# Patient Record
Sex: Female | Born: 1975
Health system: Southern US, Community
[De-identification: ages and names within clinical notes are randomized; demographics above are authoritative.]

## PROBLEM LIST (undated history)

## (undated) DIAGNOSIS — J45909 Unspecified asthma, uncomplicated: Secondary | ICD-10-CM

## (undated) DIAGNOSIS — N83209 Unspecified ovarian cyst, unspecified side: Secondary | ICD-10-CM

## (undated) HISTORY — DX: Unspecified ovarian cyst, unspecified side: N83.209

## (undated) HISTORY — PX: APPENDECTOMY: SHX54

---

## 1997-09-25 ENCOUNTER — Ambulatory Visit (HOSPITAL_COMMUNITY): Admission: RE | Admit: 1997-09-25 | Discharge: 1997-09-25 | Payer: Self-pay | Admitting: Internal Medicine

## 1999-02-12 ENCOUNTER — Emergency Department (HOSPITAL_COMMUNITY): Admission: EM | Admit: 1999-02-12 | Discharge: 1999-02-12 | Payer: Self-pay | Admitting: Internal Medicine

## 2000-07-05 ENCOUNTER — Emergency Department (HOSPITAL_COMMUNITY): Admission: EM | Admit: 2000-07-05 | Discharge: 2000-07-05 | Payer: Self-pay | Admitting: Emergency Medicine

## 2000-07-05 ENCOUNTER — Encounter: Payer: Self-pay | Admitting: Emergency Medicine

## 2001-12-21 ENCOUNTER — Other Ambulatory Visit: Admission: RE | Admit: 2001-12-21 | Discharge: 2001-12-21 | Payer: Self-pay | Admitting: Obstetrics and Gynecology

## 2006-03-24 ENCOUNTER — Ambulatory Visit (HOSPITAL_COMMUNITY): Admission: RE | Admit: 2006-03-24 | Discharge: 2006-03-24 | Payer: Self-pay | Admitting: Obstetrics

## 2006-03-30 ENCOUNTER — Inpatient Hospital Stay (HOSPITAL_COMMUNITY): Admission: RE | Admit: 2006-03-30 | Discharge: 2006-04-01 | Payer: Self-pay | Admitting: Obstetrics & Gynecology

## 2010-05-20 ENCOUNTER — Ambulatory Visit: Payer: Self-pay | Admitting: Obstetrics

## 2010-06-12 ENCOUNTER — Encounter: Payer: Self-pay | Admitting: Maternal & Fetal Medicine

## 2010-08-22 ENCOUNTER — Ambulatory Visit: Payer: Self-pay | Admitting: Obstetrics

## 2010-10-26 ENCOUNTER — Inpatient Hospital Stay (HOSPITAL_COMMUNITY)
Admission: AD | Admit: 2010-10-26 | Discharge: 2010-10-28 | DRG: 775 | Disposition: A | Discharge status: Home / Self Care | Source: Ambulatory Visit | Attending: Obstetrics & Gynecology | Admitting: Obstetrics & Gynecology

## 2010-10-26 LAB — CBC
HCT: 36.7 % (ref 36.0–46.0)
Hemoglobin: 11.9 g/dL — ABNORMAL LOW (ref 12.0–15.0)
MCH: 27.1 pg (ref 26.0–34.0)
MCHC: 32.4 g/dL (ref 30.0–36.0)
MCV: 83.6 fL (ref 78.0–100.0)
Platelets: 401 10*3/uL — ABNORMAL HIGH (ref 150–400)
RBC: 4.39 MIL/uL (ref 3.87–5.11)
RDW: 15.9 % — ABNORMAL HIGH (ref 11.5–15.5)
WBC: 9.4 10*3/uL (ref 4.0–10.5)

## 2010-10-27 LAB — CBC
Hemoglobin: 10 g/dL — ABNORMAL LOW (ref 12.0–15.0)
RBC: 3.74 MIL/uL — ABNORMAL LOW (ref 3.87–5.11)
WBC: 11.4 10*3/uL — ABNORMAL HIGH (ref 4.0–10.5)

## 2010-10-27 LAB — ABO/RH: ABO/RH(D): O POS

## 2010-10-27 LAB — RPR: RPR Ser Ql: NONREACTIVE

## 2010-11-03 ENCOUNTER — Inpatient Hospital Stay (HOSPITAL_COMMUNITY): Admission: AD | Admit: 2010-11-03 | Payer: Self-pay | Source: Home / Self Care | Admitting: Obstetrics

## 2010-11-03 NOTE — H&P (Signed)
  NAMEMARILI, [name] NO.:  [id number]  MEDICAL RECORD NO.:  [id number]  LOCATION:  9106                          FACILITY:  [facility]  PHYSICIAN:  Roseanna Rainbow, M.D.DATE OF BIRTH:  1975/11/23  DATE OF ADMISSION:  10/26/2010 DATE OF DISCHARGE:                             HISTORY & PHYSICAL   CHIEF COMPLAINT:  The patient is a 35 year old para 1 with an estimated date of confinement of October 25, 2010, complaining of rupture of membranes.  HISTORY OF PRESENT ILLNESS:  The patient reports rupture of membranes for clear blood-tinged fluid approximately 2 hours prior to presentation.  She denies contractions.  ALLERGIES:  To ACETAMINOPHEN.  MEDICATIONS:  Please see the medication reconciliation form.  PRENATAL LABORATORY DATA:  Chlamydia probe negative.  Urine culture and sensitivity insignificant growth.  GC probe negative.  A 2-hour GTT normal.  Hepatitis B surface antigen negative.  Hematocrit 34.7, hemoglobin 10.8, HIV nonreactive.  Blood type is O positive, antibody screen negative, platelets 382,000, RPR nonreactive, rubella immune. Sickle cell negative.  GBS negative on Sep 23, 2010.  PAST OBSTETRIC HISTORY:  In November of 2007, she was delivered at 41 weeks 7 pounds 6 ounces, female, vaginal delivery, no complications.  PAST GYNECOLOGIC HISTORY:  She denies.  PAST MEDICAL HISTORY:  No significant history of medical diseases.  PAST SURGICAL HISTORY:  No previous surgery.  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 She is married living with her spouse, not using alcohol, formally a minimal user, has no significant smoking history.  Denies illicit drug use.  FAMILY HISTORY:  Noncontributory.  REVIEW OF SYSTEMS:  GU:  Please see the above.  PHYSICAL EXAMINATION:  VITAL SIGNS:  Stable, afebrile.  Fetal heart tracing baseline 150, moderate long-term variability where tocodynamometer vary uterine  contractions. GENERAL:  No apparent distress. ABDOMEN:  Gravid.  Estimated fetal weight by Thayer Ohm approximately 3300 g.  Sterile vaginal exam, loose 3, 60% effaced.  A forebag was ruptured for light meconium-stained fluid.  Vertex at a -2 station.  ASSESSMENT AND PLAN:  Primipara at term, spontaneous rupture of membranes.  Category 1 fetal heart tracing.  GBS negative.  PLAN:  Admission, augmentation of labor with p.o. Cytotec.  Monitor progress, anticipate a spontaneous vaginal delivery.     Roseanna Rainbow, M.D.     [name]  D:  10/26/2010  T:  10/27/2010  Job:  [id number]  Electronically Signed by Antionette Char M.D. on 11/03/2010 10:52:31 AM

## 2011-12-31 ENCOUNTER — Other Ambulatory Visit: Payer: Self-pay | Admitting: Obstetrics

## 2012-04-18 ENCOUNTER — Encounter (HOSPITAL_BASED_OUTPATIENT_CLINIC_OR_DEPARTMENT_OTHER): Payer: Self-pay | Admitting: *Deleted

## 2012-04-18 ENCOUNTER — Emergency Department (HOSPITAL_BASED_OUTPATIENT_CLINIC_OR_DEPARTMENT_OTHER)
Admission: EM | Admit: 2012-04-18 | Discharge: 2012-04-18 | Disposition: A | Discharge status: Home / Self Care | Payer: No Typology Code available for payment source | Attending: Emergency Medicine | Admitting: Emergency Medicine

## 2012-04-18 DIAGNOSIS — Y93I9 Activity, other involving external motion: Secondary | ICD-10-CM | POA: Insufficient documentation

## 2012-04-18 DIAGNOSIS — R109 Unspecified abdominal pain: Secondary | ICD-10-CM | POA: Insufficient documentation

## 2012-04-18 DIAGNOSIS — M62838 Other muscle spasm: Secondary | ICD-10-CM | POA: Insufficient documentation

## 2012-04-18 DIAGNOSIS — Y9289 Other specified places as the place of occurrence of the external cause: Secondary | ICD-10-CM | POA: Insufficient documentation

## 2012-04-18 DIAGNOSIS — IMO0002 Reserved for concepts with insufficient information to code with codable children: Secondary | ICD-10-CM | POA: Insufficient documentation

## 2012-04-18 MED ORDER — CYCLOBENZAPRINE HCL 10 MG PO TABS: 10.0000 mg | ORAL_TABLET | Freq: Three times a day (TID) | ORAL | Status: DC | PRN
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
## 2012-04-22 NOTE — ED Provider Notes (Signed)
Medical screening examination/treatment/procedure(s) were performed by non-physician practitioner and as supervising physician I was immediately available for consultation/collaboration.   Lanah Steines B. Kariann Wecker, MD 04/22/12 1459 

## 2012-09-08 DIAGNOSIS — N83209 Unspecified ovarian cyst, unspecified side: Secondary | ICD-10-CM

## 2012-09-08 HISTORY — DX: Unspecified ovarian cyst, unspecified side: N83.209

## 2012-09-08 HISTORY — PX: OVARIAN CYST REMOVAL: SHX89

## 2012-09-13 ENCOUNTER — Encounter: Payer: Self-pay | Admitting: Family Medicine

## 2012-09-13 ENCOUNTER — Ambulatory Visit (INDEPENDENT_AMBULATORY_CARE_PROVIDER_SITE_OTHER): Admitting: Family Medicine

## 2012-09-13 VITALS — BP 120/80 | HR 78 | Temp 98.1°F | Wt 194.4 lb

## 2012-09-13 DIAGNOSIS — Z9889 Other specified postprocedural states: Secondary | ICD-10-CM

## 2012-09-13 DIAGNOSIS — Z9049 Acquired absence of other specified parts of digestive tract: Secondary | ICD-10-CM

## 2012-09-13 DIAGNOSIS — N760 Acute vaginitis: Secondary | ICD-10-CM

## 2012-09-13 DIAGNOSIS — Z8742 Personal history of other diseases of the female genital tract: Secondary | ICD-10-CM

## 2012-09-13 DIAGNOSIS — N83209 Unspecified ovarian cyst, unspecified side: Secondary | ICD-10-CM

## 2012-09-13 MED ORDER — OXYCODONE HCL 5 MG PO TABS: 1.0000 mg | ORAL_TABLET | ORAL | Status: DC | PRN

## 2012-09-13 MED ORDER — FLUCONAZOLE 150 MG PO TABS: ORAL_TABLET | ORAL | Status: DC

## 2012-09-13 NOTE — Progress Notes (Signed)
  Subjective:    Patient ID: [name], female    DOB: [birth year], 37 y.o.   MRN: [id number]  HPI Pt was brought to ER last wed with abd pain.  RLQ pain --she was found to have ovarian and appedix abnormalitly so on Thursday they removed the AP and ovarian cyst.   Pt is here for f/u and is feeling better.   See hospital d/c paperwork---[facility]  Review of Systems As above     Objective:   Physical Exam  BP 120/80  Pulse 78  Temp(Src) 98.1 F (36.7 C) (Oral)  Wt 194 lb 6.4 oz (88.179 kg)  SpO2 98% General appearance: alert, cooperative, appears stated age and no distress Lungs: clear to auscultation bilaterally Heart: S1, S2 normal Abdomen: normal findings: soft , + scars healing well but umbilical scar and LLQ scar causing pain      Assessment & Plan:  S/p AP and ovarian cystectomy---pt healing well.  Out of work until 5/22  No driving until next week and if she still needs pain meds not until 522

## 2012-09-26 ENCOUNTER — Telehealth: Payer: Self-pay | Admitting: General Practice

## 2012-09-26 NOTE — Telephone Encounter (Signed)
Pt called stating that she had been seen on 08/28/77 for a Hospital Follow up due to an appendectomy. Pt wants to know if you need to reevaluate her again before she returns to work. Pt states that all in all things are healing well, still has slight tenderness around the surgery site. Pt wanted to know if she can remain out of work until 10/04/12, due to the fact she would rather return to work at the beginning of a week instead of the end of the week. Was originally told by Provider she could return to work on 09-29-12. Please advise.

## 2012-09-26 NOTE — Telephone Encounter (Signed)
Pt aware.

## 2012-09-26 NOTE — Telephone Encounter (Signed)
Ok to return 5/27 and she was actually seen 5/6==--- not in 1979

## 2012-09-28 ENCOUNTER — Encounter: Payer: Self-pay | Admitting: Obstetrics

## 2012-09-28 ENCOUNTER — Ambulatory Visit (INDEPENDENT_AMBULATORY_CARE_PROVIDER_SITE_OTHER): Admitting: Obstetrics

## 2012-09-28 VITALS — BP 104/80 | HR 83 | Temp 97.5°F | Wt 191.0 lb

## 2012-09-28 DIAGNOSIS — N83209 Unspecified ovarian cyst, unspecified side: Secondary | ICD-10-CM

## 2012-09-28 DIAGNOSIS — K37 Unspecified appendicitis: Secondary | ICD-10-CM

## 2012-09-28 NOTE — Progress Notes (Signed)
Subjective:     [name] is a 37 y.o. female here for 2 1/2 weeks post op follow up from ovarian cyst rupture, drainage and removal at [facility].  Current complaints: none.     Gynecologic History Patient's last menstrual period was 09/13/2012. Contraception: none   The following portions of the patient's history were reviewed and updated as appropriate: allergies, current medications, past family history, past medical history, past social history, past surgical history and problem list.  Review of Systems Pertinent items are noted in HPI.    Objective:    Abdomen: soft, non-tender; bowel sounds normal; no masses,  no organomegaly    Assessment:    Healthy female exam.   S/P ovarian cystectomy and appendectomy.  Doing well. Plan:    Follow up in: 6 months.

## 2012-10-04 ENCOUNTER — Encounter: Payer: Self-pay | Admitting: Obstetrics

## 2012-10-05 ENCOUNTER — Emergency Department (HOSPITAL_BASED_OUTPATIENT_CLINIC_OR_DEPARTMENT_OTHER)
Admission: EM | Admit: 2012-10-05 | Discharge: 2012-10-05 | Disposition: A | Discharge status: Home / Self Care | Attending: Emergency Medicine | Admitting: Emergency Medicine

## 2012-10-05 ENCOUNTER — Emergency Department (HOSPITAL_BASED_OUTPATIENT_CLINIC_OR_DEPARTMENT_OTHER)

## 2012-10-05 ENCOUNTER — Encounter (HOSPITAL_BASED_OUTPATIENT_CLINIC_OR_DEPARTMENT_OTHER): Payer: Self-pay | Admitting: *Deleted

## 2012-10-05 ENCOUNTER — Encounter: Payer: Self-pay | Admitting: Obstetrics

## 2012-10-05 ENCOUNTER — Telehealth: Payer: Self-pay | Admitting: Family Medicine

## 2012-10-05 DIAGNOSIS — Z9089 Acquired absence of other organs: Secondary | ICD-10-CM | POA: Insufficient documentation

## 2012-10-05 DIAGNOSIS — Z8742 Personal history of other diseases of the female genital tract: Secondary | ICD-10-CM | POA: Insufficient documentation

## 2012-10-05 DIAGNOSIS — T814XXA Infection following a procedure, initial encounter: Secondary | ICD-10-CM

## 2012-10-05 DIAGNOSIS — Y838 Other surgical procedures as the cause of abnormal reaction of the patient, or of later complication, without mention of misadventure at the time of the procedure: Secondary | ICD-10-CM | POA: Insufficient documentation

## 2012-10-05 DIAGNOSIS — R1032 Left lower quadrant pain: Secondary | ICD-10-CM | POA: Insufficient documentation

## 2012-10-05 DIAGNOSIS — T8140XA Infection following a procedure, unspecified, initial encounter: Secondary | ICD-10-CM | POA: Insufficient documentation

## 2012-10-05 HISTORY — DX: Unspecified asthma, uncomplicated: J45.909

## 2012-10-05 LAB — BASIC METABOLIC PANEL
CO2: 23 mEq/L (ref 19–32)
Glucose, Bld: 86 mg/dL (ref 70–99)
Potassium: 3.6 mEq/L (ref 3.5–5.1)
Sodium: 139 mEq/L (ref 135–145)

## 2012-10-05 LAB — CBC WITH DIFFERENTIAL/PLATELET
Eosinophils Absolute: 0.1 10*3/uL (ref 0.0–0.7)
Eosinophils Relative: 2 % (ref 0–5)
Hemoglobin: 12.2 g/dL (ref 12.0–15.0)
Lymphocytes Relative: 28 % (ref 12–46)
Lymphs Abs: 2.4 10*3/uL (ref 0.7–4.0)
MCH: 27.8 pg (ref 26.0–34.0)
MCV: 84.7 fL (ref 78.0–100.0)
Monocytes Relative: 7 % (ref 3–12)
Neutrophils Relative %: 63 % (ref 43–77)
RBC: 4.39 MIL/uL (ref 3.87–5.11)
WBC: 8.3 10*3/uL (ref 4.0–10.5)

## 2012-10-05 MED ORDER — CLINDAMYCIN HCL 150 MG PO CAPS: 300.0000 mg | ORAL_CAPSULE | Freq: Three times a day (TID) | ORAL | Status: DC

## 2012-10-05 MED ORDER — IOHEXOL 300 MG/ML  SOLN
100.0000 mL | Freq: Once | INTRAMUSCULAR | Status: AC | PRN
  Administered 2012-10-05: 100 mL via INTRAVENOUS

## 2012-10-05 MED ORDER — OXYCODONE HCL 5 MG PO TABS: 5.0000 mg | ORAL_TABLET | ORAL | Status: DC | PRN

## 2012-10-05 MED ORDER — ONDANSETRON HCL 4 MG/2ML IJ SOLN
4.0000 mg | Freq: Once | INTRAMUSCULAR | Status: AC
  Administered 2012-10-05: 4 mg via INTRAVENOUS
  Filled 2012-10-05: qty 2

## 2012-10-05 MED ORDER — MORPHINE SULFATE 4 MG/ML IJ SOLN
4.0000 mg | Freq: Once | INTRAMUSCULAR | Status: AC
  Administered 2012-10-05: 4 mg via INTRAVENOUS
  Filled 2012-10-05: qty 1

## 2012-10-05 NOTE — Telephone Encounter (Signed)
Patient Information:  Caller Name: [name]  Phone: [phone]  Patient: Shelby, Collins  Gender: Female  DOB: [birth year]  Age: 37 Years  PCP: Lelon Perla.  Pregnant: No  Office Follow Up:  Does the office need to follow up with this patient?: No  Instructions For The Office: N/A  RN Note:  Has been off BCP for 1 month. Temp unknown; currently at work. Post op abdominal wound pain rated 7-8/10 when sitting and worse when active.  No appointments remain for 10/05/12; since its after 1600, advised to go to [facility] now.   Symptoms  Reason For Call & Symptoms: Left abdominal incision tentder to the touch and inflammed.  Noted minimal creamy disharge from center of incision 10/04/12.  Had emergency appendectomy and cystectomy 09/08/12 at [facility]  in [place].  Reviewed Health History In EMR: Yes  Reviewed Medications In EMR: Yes  Reviewed Allergies In EMR: Yes  Reviewed Surgeries / Procedures: Yes  Date of Onset of Symptoms: 10/01/2012  Treatments Tried: Peroxide  Treatments Tried Worked: Yes OB / GYN:  LMP: 09/11/2012  Guideline(s) Used:  Wound Infection  Disposition Per Guideline:   Go to Office Now  Reason For Disposition Reached:   Severe pain in the wound  Advice Given:  Pain Medicines:  For pain relief, you can take either acetaminophen, ibuprofen, or naproxen.  They are over-the-counter (OTC) pain drugs. You can buy them at the drugstore.  Call Back If:   Wound becomes more tender  Redness starts to spread  Pus, drainage, or fever occurs  You become worse  Patient Will Follow Care Advice:  YES

## 2012-10-05 NOTE — ED Provider Notes (Signed)
Medical screening examination/treatment/procedure(s) were performed by non-physician practitioner and as supervising physician I was immediately available for consultation/collaboration.   Erla Bacchi, MD 10/05/12 2353 

## 2012-10-05 NOTE — ED Provider Notes (Signed)
History     CSN: [id number]  Arrival date & time 10/05/12  1652   First MD Initiated Contact with Patient 10/05/12 1705      Chief Complaint  Patient presents with  . Wound Infection    (Consider location/radiation/quality/duration/timing/severity/associated sxs/prior treatment) HPI Comments: Pt had an appendectomy 3 weeks ago and in the last week she has noted redness and drainage at the left lower abdomen surgical site:denies fever:pt states that she had some white drainage from the area last night:pt states that she has noted to redness to the area over the last week  The history is provided by the patient. No language interpreter was used.    Past Medical History  Diagnosis Date  . Ovarian cyst rupture 09/08/2012    left    Past Surgical History  Procedure Laterality Date  . Appendectomy    . Ovarian cyst removal Right 09/08/2012    drained and removed    History reviewed. No pertinent family history.  History  Substance Use Topics  . Smoking status: Never Smoker   . Smokeless tobacco: Never Used  . Alcohol Use: No    OB History   Grav Para Term Preterm Abortions TAB SAB Ect Mult Living                  Review of Systems  Constitutional: Negative.   Respiratory: Negative.   Cardiovascular: Negative.     Allergies  Acetaminophen  Home Medications   Current Outpatient Rx  Name  Route  Sig  Dispense  Refill  . ibuprofen (ADVIL,MOTRIN) 200 MG tablet   Oral   Take 200 mg by mouth every 6 (six) hours as needed for pain.         . Norethindrone Acetate-Ethinyl Estradiol (LOESTRIN 1.5/30, 21,) 1.5-30 MG-MCG tablet   Oral   Take 1 tablet by mouth daily.         Marland Kitchen oxyCODONE (OXY IR/ROXICODONE) 5 MG immediate release tablet   Oral   Take 0.5 tablets (2.5 mg total) by mouth every 4 (four) hours as needed for pain.   30 tablet   0     BP 136/86  Pulse 92  Temp(Src) 97.9 F (36.6 C) (Oral)  Resp 16  Ht 5\' 5"  (1.651 m)  Wt 190 lb (86.183 kg)   BMI 31.62 kg/m2  SpO2 100%  LMP 09/11/2012  Physical Exam  Nursing note and vitals reviewed. Constitutional: She is oriented to person, place, and time. She appears well-developed and well-nourished.  Cardiovascular: Normal rate and regular rhythm.   Pulmonary/Chest: Effort normal and breath sounds normal.  Abdominal: Soft.  Pt tender to the left lower quadrant with mild redness noted:pt has a small firm area, but tenderness extends past the area  Musculoskeletal: Normal range of motion.  Neurological: She is alert and oriented to person, place, and time.  Skin:  Localized area of redness on lower left abdomen:no drainage noted    ED Course  Procedures (including critical care time)  Labs Reviewed  BASIC METABOLIC PANEL  CBC WITH DIFFERENTIAL  CBC WITH DIFFERENTIAL   Ct Pelvis W Contrast  10/05/2012   *RADIOLOGY REPORT*  Clinical Data:  Status post appendectomy and removal of ovarian cysts 4 weeks ago. Pelvic pain and tenderness with discharge from surgical incision.  CT PELVIS WITH CONTRAST  Technique:  Multidetector CT imaging of the pelvis was performed using the standard protocol following the bolus administration of intravenous contrast.  Contrast: OMNIPAQUE IOHEXOL 300 MG/ML  SOLN  Comparison:   None.  Findings:  There is focal stranding in the subcutaneous fat of the left lower quadrant and overlying skin thickening.  This may be a trocar insertion site.  No focal abscess is identified. Clips are seen at the inferior cecal tip related to appendectomy.  There is no evidence of abnormal fluid collection or extraluminal air at the site of appendectomy.  Bowel loops in the pelvis are of normal caliber and unremarkable. A trace amount of fluid is identified adjacent to the uterus. Adnexal regions show no enlarged cysts or masses.  No hernias are identified.  No evidence of free air in the pelvis.  IMPRESSION: Skin thickening and stranding in the subcutaneous fat at the level of the  left lower quadrant.  No focal abscess is identified. Uncomplicated appearing postoperative pelvis.   Original Report Authenticated By: Irish Lack, M.D.     1. Post-operative infection, initial encounter       MDM  No definite abscess:pt to follow up with pcp for recheck in 2 days as surgeons are not local        Teressa Lower, NP 10/05/12 1911

## 2012-10-05 NOTE — ED Notes (Signed)
Pt reports recent appendectomy x 3 weeks ago, lower left abd pain with redness to incision site x 2 days, at times white drainage from area.

## 2012-10-05 NOTE — ED Notes (Signed)
[name] from lab sts cbc specimen is "clotted".

## 2012-10-05 NOTE — Telephone Encounter (Signed)
Pt was called and stated that her incision area where she had emergency appendectomy and cyst removal. Pt states she is in severe pain and has white creamy discharge coming from the incision site. Per Dr. Laury Axon pt advised to seek treatment at the ER for possible infection.

## 2012-10-06 ENCOUNTER — Encounter: Payer: Self-pay | Admitting: Lab

## 2012-10-06 ENCOUNTER — Ambulatory Visit (INDEPENDENT_AMBULATORY_CARE_PROVIDER_SITE_OTHER): Admitting: Family Medicine

## 2012-10-06 ENCOUNTER — Encounter: Payer: Self-pay | Admitting: Family Medicine

## 2012-10-06 VITALS — BP 114/74 | HR 73 | Temp 98.2°F | Wt 194.4 lb

## 2012-10-06 DIAGNOSIS — Z9049 Acquired absence of other specified parts of digestive tract: Secondary | ICD-10-CM

## 2012-10-06 DIAGNOSIS — Z9889 Other specified postprocedural states: Secondary | ICD-10-CM

## 2012-10-06 DIAGNOSIS — T8140XA Infection following a procedure, unspecified, initial encounter: Secondary | ICD-10-CM

## 2012-10-06 NOTE — Assessment & Plan Note (Signed)
With wound infection that is healing well Finish abx F/u 1 week Surgeon is out of town

## 2012-10-06 NOTE — Patient Instructions (Signed)
Wound Infection  A wound infection happens when a type of germ (bacteria) starts growing in the wound. In some cases, this can cause the wound to break open. If cared for properly, the infected wound will heal from the inside to the outside. Wound infections need treatment.  CAUSES  An infection is caused by bacteria growing in the wound.   SYMPTOMS    Increase in redness, swelling, or pain at the wound site.   Increase in drainage at the wound site.   Wound or bandage (dressing) starts to smell bad.   Fever.   Feeling tired or fatigued.   Pus draining from the wound.  TREATMENT   You caregiver will prescribe antibiotic medicine. The wound infection should improve within 24 to 48 hours. Any redness around the wound should stop spreading and the wound should be less painful.   HOME CARE INSTRUCTIONS    Only take over-the-counter or prescription medicines for pain, discomfort, or fever as directed by your caregiver.   Take your antibiotics as directed. Finish them even if you start to feel better.   Gently wash the area with mild soap and water 2 times a day, or as directed. Rinse off the soap. Pat the area dry with a clean towel. Do not rub the wound. This may cause bleeding.   Follow your caregiver's instructions for how often you need to change the dressing.   Apply ointment and a dressing to the wound as directed.   If the dressing sticks, moisten it with soapy water and gently remove it.   Change the bandage right away if it becomes wet, dirty, or develops a bad smell.   Take showers. Do not take tub baths, swim, or do anything that may soak the wound until it is healed.   Avoid exercises that make you sweat heavily.   Use anti-itch medicine as directed by your caregiver. The wound may itch when it is healing. Do not pick or scratch at the wound.   Follow up with your caregiver to get your wound rechecked as directed.  SEEK MEDICAL CARE IF:   You have an increase in swelling, pain, or redness  around the wound.   You have an increase in the amount of pus coming from the wound.   There is a bad smell coming from the wound.   More of the wound breaks open.   You have a fever.  MAKE SURE YOU:    Understand these instructions.   Will watch your condition.   Will get help right away if you are not doing well or get worse.  Document Released: 01/24/2003 Document Revised: 07/20/2011 Document Reviewed: 08/31/2010  Exit[employer] Patient Information 2014 [employer].

## 2012-10-06 NOTE — Progress Notes (Signed)
  Subjective:    Patient ID: [name], female    DOB: 06-26-1975, 37 y.o.   MRN: [id number]  HPI  Pt here to f/u from ER where she went with abd pain and infection in wound from AP.  See ER note.  Pt was put on abx and is feeling much better.  Review of Systems    as above Objective:   Physical Exam BP 114/74  Pulse 73  Temp(Src) 98.2 F (36.8 C) (Oral)  Wt 194 lb 6.4 oz (88.179 kg)  BMI 32.35 kg/m2  SpO2 98%  LMP 09/11/2012 General appearance: alert, cooperative, appears stated age and no distress Abdomen: soft, non-tender; bowel sounds normal; no masses,  no organomegaly and LLQ -- lap hole-- no errythema , nt, some d/c-- pt states it is much better than previous        Assessment & Plan:

## 2012-10-07 ENCOUNTER — Encounter: Payer: Self-pay | Admitting: Obstetrics

## 2012-10-09 LAB — WOUND CULTURE
Gram Stain: NONE SEEN
Gram Stain: NONE SEEN

## 2012-10-12 ENCOUNTER — Encounter: Payer: Self-pay | Admitting: Lab

## 2012-10-13 ENCOUNTER — Encounter: Payer: Self-pay | Admitting: Family Medicine

## 2012-10-13 ENCOUNTER — Ambulatory Visit (INDEPENDENT_AMBULATORY_CARE_PROVIDER_SITE_OTHER): Admitting: Family Medicine

## 2012-10-13 VITALS — BP 118/72 | HR 83 | Temp 98.4°F | Wt 194.0 lb

## 2012-10-13 DIAGNOSIS — R200 Anesthesia of skin: Secondary | ICD-10-CM

## 2012-10-13 DIAGNOSIS — T889XXS Complication of surgical and medical care, unspecified, sequela: Secondary | ICD-10-CM

## 2012-10-13 DIAGNOSIS — R209 Unspecified disturbances of skin sensation: Secondary | ICD-10-CM

## 2012-10-13 DIAGNOSIS — T8149XA Infection following a procedure, other surgical site, initial encounter: Secondary | ICD-10-CM | POA: Insufficient documentation

## 2012-10-13 NOTE — Assessment & Plan Note (Signed)
- 

## 2012-10-13 NOTE — Progress Notes (Signed)
  Subjective:    Patient ID: [name], female    DOB: 10-25-1975, 37 y.o.   MRN: [id number]  HPI Pt here for wound check.  She is feeling much better. No complaints.   Review of Systems As above    Objective:   Physical Exam  BP 118/72  Pulse 83  Temp(Src) 98.4 F (36.9 C) (Oral)  Wt 194 lb (87.998 kg)  BMI 32.28 kg/m2  SpO2 99%  LMP 09/11/2012 General appearance: alert, cooperative, appears stated age and no distress Abdomen: soft, non-tender; bowel sounds normal; no masses,  no organomegaly--wound healed      Assessment & Plan:

## 2012-10-18 ENCOUNTER — Ambulatory Visit (INDEPENDENT_AMBULATORY_CARE_PROVIDER_SITE_OTHER): Admitting: Family Medicine

## 2012-10-18 DIAGNOSIS — Z0000 Encounter for general adult medical examination without abnormal findings: Secondary | ICD-10-CM

## 2012-10-19 NOTE — Progress Notes (Signed)
  Subjective:    Patient ID: [name], female    DOB: 17-Jan-1976, 37 y.o.   MRN: [id number]  HPI  cpe not due   Review of Systems     Objective:   Physical Exam        Assessment & Plan:

## 2012-10-26 ENCOUNTER — Encounter: Payer: Self-pay | Admitting: Family Medicine

## 2012-11-03 ENCOUNTER — Telehealth: Payer: Self-pay | Admitting: Family Medicine

## 2012-11-03 NOTE — Telephone Encounter (Signed)
That referral was entered in error.

## 2012-11-03 NOTE — Telephone Encounter (Signed)
noted 

## 2012-11-03 NOTE — Telephone Encounter (Signed)
In reference to neurology referral entered 10/13/12, [facility] attempted to reach patient multiple times, as did I with no success.  I also mailed patient a letter.  As of today, 11/03/12, patient will not respond.

## 2012-12-12 ENCOUNTER — Ambulatory Visit: Admitting: Obstetrics

## 2012-12-12 ENCOUNTER — Ambulatory Visit: Payer: Self-pay | Admitting: Obstetrics

## 2012-12-21 ENCOUNTER — Ambulatory Visit (INDEPENDENT_AMBULATORY_CARE_PROVIDER_SITE_OTHER): Admitting: Obstetrics

## 2012-12-21 ENCOUNTER — Encounter: Payer: Self-pay | Admitting: Obstetrics

## 2012-12-21 DIAGNOSIS — Z113 Encounter for screening for infections with a predominantly sexual mode of transmission: Secondary | ICD-10-CM

## 2012-12-21 DIAGNOSIS — Z01419 Encounter for gynecological examination (general) (routine) without abnormal findings: Secondary | ICD-10-CM

## 2012-12-21 NOTE — Progress Notes (Signed)
.   Subjective:     [name] is a 37 y.o. female here for a routine exam.  Current complaints: .  Personal health questionnaire reviewed: yes.   Gynecologic History No LMP recorded. Contraception: OCP (estrogen/progesterone) Last Pap: 12/2009. Results were: normal Last mammogram: N/A  Obstetric History OB History  No data available     The following portions of the patient's history were reviewed and updated as appropriate: allergies, current medications, past family history, past medical history, past social history, past surgical history and problem list.  Review of Systems Pertinent items are noted in HPI.    Objective:    General appearance: alert and no distress Breasts: normal appearance, no masses or tenderness Abdomen: normal findings: soft, non-tender Pelvic: cervix normal in appearance, external genitalia normal, no adnexal masses or tenderness, no cervical motion tenderness, uterus normal size, shape, and consistency and vagina normal without discharge    Assessment:    Healthy female exam.    Plan:    Contraception: OCP (estrogen/progesterone).   F/U 1 year.

## 2012-12-22 ENCOUNTER — Other Ambulatory Visit: Payer: Self-pay | Admitting: *Deleted

## 2012-12-22 DIAGNOSIS — B379 Candidiasis, unspecified: Secondary | ICD-10-CM

## 2012-12-22 DIAGNOSIS — N760 Acute vaginitis: Secondary | ICD-10-CM

## 2012-12-22 LAB — WET PREP BY MOLECULAR PROBE
Gardnerella vaginalis: POSITIVE — AB
Trichomonas vaginosis: NEGATIVE

## 2012-12-22 LAB — PAP IG W/ RFLX HPV ASCU

## 2012-12-22 LAB — GC/CHLAMYDIA PROBE AMP: CT Probe RNA: NEGATIVE

## 2012-12-22 MED ORDER — FLUCONAZOLE 150 MG PO TABS: 150.0000 mg | ORAL_TABLET | Freq: Once | ORAL | Status: DC

## 2012-12-22 MED ORDER — METRONIDAZOLE 500 MG PO TABS: 500.0000 mg | ORAL_TABLET | Freq: Two times a day (BID) | ORAL | Status: DC

## 2012-12-22 NOTE — Progress Notes (Signed)
Pt aware of results and prescription sent to pt's pharmacy. Pt expressed understanding.   

## 2013-07-11 ENCOUNTER — Encounter: Payer: Self-pay | Admitting: Obstetrics

## 2013-07-11 ENCOUNTER — Ambulatory Visit (INDEPENDENT_AMBULATORY_CARE_PROVIDER_SITE_OTHER): Admitting: Obstetrics

## 2013-07-11 VITALS — BP 124/81 | HR 94 | Temp 98.5°F | Ht 64.0 in | Wt 197.0 lb

## 2013-07-11 DIAGNOSIS — N926 Irregular menstruation, unspecified: Secondary | ICD-10-CM

## 2013-07-11 DIAGNOSIS — Z113 Encounter for screening for infections with a predominantly sexual mode of transmission: Secondary | ICD-10-CM

## 2013-07-11 DIAGNOSIS — N760 Acute vaginitis: Secondary | ICD-10-CM | POA: Insufficient documentation

## 2013-07-11 DIAGNOSIS — N939 Abnormal uterine and vaginal bleeding, unspecified: Secondary | ICD-10-CM

## 2013-07-11 NOTE — Progress Notes (Signed)
Subjective:     [name] is a 38 y.o. female here for a routine exam.  Current complaints: Patient is in the office today for a problem visit. Patient states her cycle started on January 31st and didn't end until Thursday. Patient states that at the end of December, early January her cycle was her normal 5 days then went off for one day and came back the next day and was like another normal cycle lasting another week.  Patient states cycle was normal in November and usually only last 5-7 days. Patient states these past cycles were like normal cycles just lasting longer then usual. Personal health questionnaire reviewed: yes.   Gynecologic History Patient's last menstrual period was 06/10/2013. Contraception: OCP (estrogen/progesterone) Last Pap: 12/21/2012. Results were: normal  Obstetric History OB History  Gravida Para Term Preterm AB SAB TAB Ectopic Multiple Living  2 2 2       2     # Outcome Date GA Lbr Len/2nd Weight Sex Delivery Anes PTL Lv  2 TRM 10/26/10 [redacted]w[redacted]d  7 lb 6 oz (3.345 kg) F SVD EPI  Y  1 TRM 03/30/06 [redacted]w[redacted]d  7 lb 8 oz (3.402 kg) F SVD EPI  Y       The following portions of the patient's history were reviewed and updated as appropriate: allergies, current medications, past family history, past medical history, past social history, past surgical history and problem list.  Review of Systems Pertinent items are noted in HPI.    Objective:    General appearance: alert and no distress Abdomen: normal findings: soft, non-tender Pelvic: cervix normal in appearance, exam obscured by obesity, external genitalia normal, no cervical motion tenderness, rectovaginal septum normal, uterus normal size, shape, and consistency and vagina with small amount of cottage cheese discharge.    Assessment:     Healthy female exam.   Probable asymptomatic candida vaginitis.   Plan:    Education reviewed: Management of AUB on OCP's. Contraception: OCP  (estrogen/progesterone). Follow up in: 3 months. May need a stronger OCP.

## 2013-07-12 LAB — GC/CHLAMYDIA PROBE AMP
CT Probe RNA: NEGATIVE
GC PROBE AMP APTIMA: NEGATIVE

## 2013-07-12 LAB — WET PREP BY MOLECULAR PROBE
Candida species: POSITIVE — AB
Gardnerella vaginalis: POSITIVE — AB
Trichomonas vaginosis: NEGATIVE

## 2013-07-14 ENCOUNTER — Other Ambulatory Visit: Payer: Self-pay | Admitting: *Deleted

## 2013-07-14 DIAGNOSIS — B9689 Other specified bacterial agents as the cause of diseases classified elsewhere: Secondary | ICD-10-CM

## 2013-07-14 DIAGNOSIS — N760 Acute vaginitis: Secondary | ICD-10-CM

## 2013-07-14 DIAGNOSIS — B379 Candidiasis, unspecified: Secondary | ICD-10-CM

## 2013-07-14 MED ORDER — FLUCONAZOLE 150 MG PO TABS: 150.0000 mg | ORAL_TABLET | Freq: Once | ORAL | Status: DC

## 2013-07-14 MED ORDER — METRONIDAZOLE 500 MG PO TABS: 500.0000 mg | ORAL_TABLET | Freq: Two times a day (BID) | ORAL | Status: DC

## 2013-08-02 ENCOUNTER — Other Ambulatory Visit: Payer: Self-pay | Admitting: *Deleted

## 2013-08-02 DIAGNOSIS — IMO0001 Reserved for inherently not codable concepts without codable children: Secondary | ICD-10-CM

## 2013-08-02 MED ORDER — NORETHINDRONE ACET-ETHINYL EST 1.5-30 MG-MCG PO TABS: 1.0000 | ORAL_TABLET | Freq: Every day | ORAL | Status: DC

## 2013-12-14 ENCOUNTER — Ambulatory Visit: Admitting: Obstetrics

## 2014-02-27 ENCOUNTER — Other Ambulatory Visit: Payer: Self-pay | Admitting: *Deleted

## 2014-02-27 DIAGNOSIS — Z3041 Encounter for surveillance of contraceptive pills: Secondary | ICD-10-CM

## 2014-02-27 MED ORDER — NORETHINDRONE ACET-ETHINYL EST 1.5-30 MG-MCG PO TABS: 1.0000 | ORAL_TABLET | Freq: Every day | ORAL | Status: DC

## 2014-03-12 ENCOUNTER — Encounter: Payer: Self-pay | Admitting: Obstetrics

## 2014-03-27 ENCOUNTER — Ambulatory Visit: Admitting: Obstetrics

## 2014-04-10 ENCOUNTER — Telehealth: Payer: Self-pay | Admitting: *Deleted

## 2014-04-10 NOTE — Telephone Encounter (Signed)
Pt called to office stating that her BC runs out this week and will need refill to get her to her AEX this month.  In review of chart pt was sent Rx in October with 6 refills.  Return call to pt.  LM making her aware that refills should be on file for [facility].  Pt advised to contact office if any problems filling medication.

## 2014-05-01 ENCOUNTER — Ambulatory Visit: Admitting: Obstetrics

## 2014-08-28 ENCOUNTER — Other Ambulatory Visit: Payer: Self-pay | Admitting: *Deleted

## 2014-08-28 DIAGNOSIS — Z3041 Encounter for surveillance of contraceptive pills: Secondary | ICD-10-CM

## 2014-08-28 MED ORDER — NORETHINDRONE ACET-ETHINYL EST 1.5-30 MG-MCG PO TABS: 1.0000 | ORAL_TABLET | Freq: Every day | ORAL | Status: DC

## 2014-12-03 ENCOUNTER — Encounter: Payer: Self-pay | Admitting: Obstetrics

## 2014-12-03 ENCOUNTER — Other Ambulatory Visit: Payer: Self-pay | Admitting: Obstetrics

## 2014-12-03 ENCOUNTER — Ambulatory Visit (INDEPENDENT_AMBULATORY_CARE_PROVIDER_SITE_OTHER): Admitting: Obstetrics

## 2014-12-03 VITALS — BP 122/79 | HR 81 | Temp 98.1°F | Ht 64.0 in | Wt 177.4 lb

## 2014-12-03 DIAGNOSIS — Z124 Encounter for screening for malignant neoplasm of cervix: Secondary | ICD-10-CM

## 2014-12-03 DIAGNOSIS — Z3041 Encounter for surveillance of contraceptive pills: Secondary | ICD-10-CM | POA: Diagnosis not present

## 2014-12-03 DIAGNOSIS — Z01419 Encounter for gynecological examination (general) (routine) without abnormal findings: Secondary | ICD-10-CM

## 2014-12-03 MED ORDER — NORETHINDRONE ACET-ETHINYL EST 1.5-30 MG-MCG PO TABS
1.0000 | ORAL_TABLET | Freq: Every day | ORAL | Status: DC
Start: 2014-12-03 — End: 2015-12-03

## 2014-12-03 NOTE — Progress Notes (Signed)
Subjective:        [name] is a 39 y.o. female here for a routine exam.  Current complaints: none.    Personal health questionnaire:  Is patient Ashkenazi Jewish, have a family history of breast and/or ovarian cancer: no Is there a family history of uterine cancer diagnosed at age < 72, gastrointestinal cancer, urinary tract cancer, family member who is a Field seismologist syndrome-associated carrier: no Is the patient overweight and hypertensive, family history of diabetes, personal history of gestational diabetes, preeclampsia or PCOS: no Is patient over 33, have PCOS,  family history of premature CHD under age 33, diabetes, smoke, have hypertension or peripheral artery disease:  no At any time, has a partner hit, kicked or otherwise hurt or frightened you?: no Over the past 2 weeks, have you felt down, depressed or hopeless?: no Over the past 2 weeks, have you felt little interest or pleasure in doing things?:no   Gynecologic History Patient's last menstrual period was 11/05/2014 (approximate). Contraception: OCP (estrogen/progesterone) Last Pap: 2014. Results were: normal Last mammogram: n/a. Results were: n/a  Obstetric History OB History  Gravida Para Term Preterm AB SAB TAB Ectopic Multiple Living  2 2 2       2     # Outcome Date GA Lbr Len/2nd Weight Sex Delivery Anes PTL Lv  2 Term 10/26/10 [redacted]w[redacted]d  7 lb 6 oz (3.345 kg) F Vag-Spont EPI  Y  1 Term 03/30/06 [redacted]w[redacted]d  7 lb 8 oz (3.402 kg) F Vag-Spont EPI  Y      Past Medical History  Diagnosis Date  . Ovarian cyst rupture 09/08/2012    left  . Asthma     Past Surgical History  Procedure Laterality Date  . Appendectomy    . Ovarian cyst removal Right 09/08/2012    drained and removed     Current outpatient prescriptions:  .  Norethindrone Acetate-Ethinyl Estradiol (LOESTRIN 1.5/30, 21,) 1.5-30 MG-MCG tablet, Take 1 tablet by mouth daily., Disp: 1 Package, Rfl: 12 .  oxyCODONE (ROXICODONE) 5 MG immediate release tablet,  Take 1 tablet (5 mg total) by mouth every 4 (four) hours as needed for pain. (Patient not taking: Reported on 12/03/2014), Disp: 15 tablet, Rfl: 0 Allergies  Allergen Reactions  . Acetaminophen Hives    History  Substance Use Topics  . Smoking status: Never Smoker   . Smokeless tobacco: Never Used  . Alcohol Use: No    History reviewed. No pertinent family history.    Review of Systems  Constitutional: negative for fatigue and weight loss Respiratory: negative for cough and wheezing Cardiovascular: negative for chest pain, fatigue and palpitations Gastrointestinal: negative for abdominal pain and change in bowel habits Musculoskeletal:negative for myalgias Neurological: negative for gait problems and tremors Behavioral/Psych: negative for abusive relationship, depression Endocrine: negative for temperature intolerance   Genitourinary:negative for abnormal menstrual periods, genital lesions, hot flashes, sexual problems and vaginal discharge Integument/breast: negative for breast lump, breast tenderness, nipple discharge and skin lesion(s)    Objective:       BP 122/79 mmHg  Pulse 81  Temp(Src) 98.1 F (36.7 C)  Ht 5\' 4"  (1.626 m)  Wt 177 lb 6.4 oz (80.468 kg)  BMI 30.44 kg/m2  LMP 11/05/2014 (Approximate) General:   alert  Skin:   no rash or abnormalities  Lungs:   clear to auscultation bilaterally  Heart:   regular rate and rhythm, S1, S2 normal, no murmur, click, rub or gallop  Breasts:   normal without  suspicious masses, skin or nipple changes or axillary nodes  Abdomen:  normal findings: no organomegaly, soft, non-tender and no hernia  Pelvis:  External genitalia: normal general appearance Urinary system: urethral meatus normal and bladder without fullness, nontender Vaginal: normal without tenderness, induration or masses Cervix: normal appearance Adnexa: normal bimanual exam Uterus: anteverted and non-tender, normal size   Lab Review Urine pregnancy  test Labs reviewed yes Radiologic studies reviewed no    Assessment:    Healthy female exam.   Contraceptive surveillance   Plan:    Education reviewed: calcium supplements, low fat, low cholesterol diet, safe sex/STD prevention, self breast exams and weight bearing exercise. Contraception: OCP (estrogen/progesterone). Follow up in: 1 year.   Meds ordered this encounter  Medications  . Norethindrone Acetate-Ethinyl Estradiol (LOESTRIN 1.5/30, 21,) 1.5-30 MG-MCG tablet    Sig: Take 1 tablet by mouth daily.    Dispense:  1 Package    Refill:  12   Orders Placed This Encounter  Procedures  . SureSwab Bacterial Vaginosis/itis

## 2014-12-05 LAB — PAP IG AND HPV HIGH-RISK: HPV DNA High Risk: NOT DETECTED

## 2014-12-06 LAB — SURESWAB BACTERIAL VAGINOSIS/ITIS
Atopobium vaginae: NOT DETECTED Log (cells/mL)
C. albicans, DNA: NOT DETECTED
C. glabrata, DNA: NOT DETECTED
C. parapsilosis, DNA: NOT DETECTED
C. tropicalis, DNA: NOT DETECTED
Gardnerella vaginalis: NOT DETECTED Log (cells/mL)
MEGASPHAERA SPECIES: NOT DETECTED Log (cells/mL)
T. vaginalis RNA, QL TMA: NOT DETECTED

## 2014-12-28 ENCOUNTER — Encounter: Payer: Self-pay | Admitting: *Deleted

## 2015-12-03 ENCOUNTER — Other Ambulatory Visit: Payer: Self-pay | Admitting: Obstetrics

## 2015-12-03 DIAGNOSIS — Z3041 Encounter for surveillance of contraceptive pills: Secondary | ICD-10-CM

## 2015-12-05 ENCOUNTER — Ambulatory Visit: Admitting: Obstetrics

## 2016-10-16 ENCOUNTER — Other Ambulatory Visit: Payer: Self-pay | Admitting: Obstetrics

## 2016-10-16 DIAGNOSIS — Z3041 Encounter for surveillance of contraceptive pills: Secondary | ICD-10-CM

## 2016-11-17 ENCOUNTER — Telehealth: Payer: Self-pay | Admitting: Family Medicine

## 2016-11-17 NOTE — Telephone Encounter (Signed)
Pt dropped off document to be filled out by provider (document Screening Questionnaire for the [facility] page). Pt need if possible to have document emailed to [email or web address] or to call her at [phone]. Document put at front office tray. Pt needs it ASAP.

## 2016-11-17 NOTE — Telephone Encounter (Signed)
Document was given back to pt since pt has not been seen in our office since 2014. Pt has to establish with [place] as New Pt.

## 2016-11-19 ENCOUNTER — Ambulatory Visit (HOSPITAL_BASED_OUTPATIENT_CLINIC_OR_DEPARTMENT_OTHER)
Admission: RE | Admit: 2016-11-19 | Discharge: 2016-11-19 | Disposition: A | Discharge status: Home / Self Care | Source: Ambulatory Visit | Attending: Family Medicine | Admitting: Family Medicine

## 2016-11-19 ENCOUNTER — Ambulatory Visit (INDEPENDENT_AMBULATORY_CARE_PROVIDER_SITE_OTHER): Admitting: Family Medicine

## 2016-11-19 ENCOUNTER — Encounter: Payer: Self-pay | Admitting: Family Medicine

## 2016-11-19 VITALS — BP 112/80 | HR 78 | Temp 98.1°F | Ht 64.0 in | Wt 185.2 lb

## 2016-11-19 DIAGNOSIS — M25562 Pain in left knee: Secondary | ICD-10-CM | POA: Insufficient documentation

## 2016-11-19 DIAGNOSIS — Z3041 Encounter for surveillance of contraceptive pills: Secondary | ICD-10-CM | POA: Diagnosis not present

## 2016-11-19 DIAGNOSIS — G8929 Other chronic pain: Secondary | ICD-10-CM | POA: Insufficient documentation

## 2016-11-19 DIAGNOSIS — Z91030 Bee allergy status: Secondary | ICD-10-CM

## 2016-11-19 DIAGNOSIS — Z91038 Other insect allergy status: Secondary | ICD-10-CM

## 2016-11-19 MED ORDER — NORETHINDRONE ACET-ETHINYL EST 1.5-30 MG-MCG PO TABS: 1.0000 | ORAL_TABLET | Freq: Every day | ORAL | 12 refills | Status: DC

## 2016-11-19 NOTE — Progress Notes (Signed)
Pre visit review using our clinic review tool, if applicable. No additional management support is needed unless otherwise documented below in the visit note. 

## 2016-11-19 NOTE — Progress Notes (Signed)
Chief Complaint  Patient presents with  . Establish Care       New Patient Visit SUBJECTIVE: HPI: [name] is an 41 y.o.female who is being seen for establishing care.  The patient was previously seen here but has not been seen in >3 years.   She needs a form filled out stating that she has an allergic reaction to bees, however is not anaphylactic and she does not require an EpiPen. She requires this signed every 5 years. She has not been stung in around 5 years also.  The patient also has a 15 year history of left knee pain. There is no specific injury or change in activity that started it, however she never fully recovered. She's been through physical therapy which initially helped, however soon worsen after. She feels it on the left anterior knee on the medial side. It will sometimes catch/lock on her and feel unsteady. She's never had any imaging other than an initial x-ray. It will intermittently swell.  Allergies  Allergen Reactions  . Acetaminophen Hives    Past Medical History:  Diagnosis Date  . Asthma   . Ovarian cyst rupture 09/08/2012   left   Past Surgical History:  Procedure Laterality Date  . APPENDECTOMY    . OVARIAN CYST REMOVAL Right 09/08/2012   drained and removed   Social History   Social History  . Marital status: Married    Spouse name: [name].   . 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 Smoking status: Never Smoker  . Smokeless tobacco: Never Used  . Alcohol use No  . Drug use: No  . Sexual activity: Yes    Partners: Male    Birth control/ protection: Pill   Family History  Problem Relation Age of Onset  . Cancer Neg Hx      Current Outpatient Prescriptions:  .  Norethindrone Acetate-Ethinyl Estradiol (JUNEL 1.5/30) 1.5-30 MG-MCG tablet, Take 1 tablet by mouth daily., Disp: 21 tablet, Rfl: 12  ROS MSK: As noted in HPI  Respiratory: Denies  dyspnea   OBJECTIVE: BP 112/80 (BP Location: Left Arm, Patient Position: Sitting, Cuff Size: Normal)   Pulse 78   Temp 98.1 F (36.7 C) (Oral)   Ht 5\' 4"  (1.626 m)   Wt 185 lb 4 oz (84 kg)   SpO2 99%   BMI 31.80 kg/m   Constitutional: -  VS reviewed -  Well developed, well nourished, appears stated age -  No apparent distress  Psychiatric: -  Oriented to person, place, and time -  Memory intact -  Affect and mood normal -  Fluent conversation, good eye contact -  Judgment and insight age appropriate  Eye: -  Conjunctivae clear, no discharge -  Pupils symmetric, round, reactive to light  ENMT: -  Ears are patent b/l without erythema or discharge. TM's are shiny and clear b/l without evidence of effusion or infection. -  Oral mucosa without lesions, tongue and uvula midline    Tonsils not enlarged, no erythema, no exudate, trachea midline    Pharynx moist, no lesions, no erythema  Neck: -  No gross swelling, no palpable masses -  Thyroid midline, not enlarged, mobile, no palpable masses  Cardiovascular: -  RRR, no murmurs -  No LE edema  Respiratory: -  Normal respiratory effort, no accessory muscle use, no retraction -  Breath sounds equal, no wheezes,  no ronchi, no crackles  Musculoskeletal: -  No clubbing, no cyanosis -  Gait normal -  L knee: No deformity, antero--medial joint line tenderness, no other areas of tenderness, no effusion, no warmth, negative McMurray's, Lachman's, patellar apprehension, patellar grind, varus/valgus stress   Skin: -  No significant lesion on inspection -  Warm and dry to palpation   ASSESSMENT/PLAN: Chronic pain of left knee - Plan: DG Knee Complete 4 Views Left  Bee sting allergy  Encounter for surveillance of contraceptive pills - Plan: Norethindrone Acetate-Ethinyl Estradiol (JUNEL 1.5/30) 1.5-30 MG-MCG tablet  Form filled out and copy made. XR on the way out. If neg, will order MRI, concern with medial meniscus given exam and  hx. Patient should return pending above. The patient voiced understanding and agreement to the plan.   [place], DO 11/19/16  5:10 PM

## 2016-11-19 NOTE — Patient Instructions (Signed)
We will let you know the results of your XR when it comes back via MyChart. MRI will likely follow. Further management will be based on these results.   Let us know if you need anything.

## 2016-11-23 ENCOUNTER — Other Ambulatory Visit: Payer: Self-pay | Admitting: Family Medicine

## 2016-11-23 DIAGNOSIS — M25562 Pain in left knee: Principal | ICD-10-CM

## 2016-11-23 DIAGNOSIS — G8929 Other chronic pain: Secondary | ICD-10-CM

## 2016-11-28 ENCOUNTER — Ambulatory Visit (HOSPITAL_BASED_OUTPATIENT_CLINIC_OR_DEPARTMENT_OTHER)
Admission: RE | Admit: 2016-11-28 | Discharge: 2016-11-28 | Disposition: A | Discharge status: Home / Self Care | Source: Ambulatory Visit | Attending: Family Medicine | Admitting: Family Medicine

## 2016-11-28 DIAGNOSIS — M659 Synovitis and tenosynovitis, unspecified: Secondary | ICD-10-CM | POA: Diagnosis not present

## 2016-11-28 DIAGNOSIS — M7122 Synovial cyst of popliteal space [Baker], left knee: Secondary | ICD-10-CM | POA: Diagnosis not present

## 2016-11-28 DIAGNOSIS — G8929 Other chronic pain: Secondary | ICD-10-CM | POA: Insufficient documentation

## 2016-11-28 DIAGNOSIS — D169 Benign neoplasm of bone and articular cartilage, unspecified: Secondary | ICD-10-CM | POA: Insufficient documentation

## 2016-11-28 DIAGNOSIS — M25562 Pain in left knee: Secondary | ICD-10-CM | POA: Diagnosis not present

## 2017-01-08 ENCOUNTER — Encounter: Payer: Self-pay | Admitting: Family Medicine

## 2017-01-08 ENCOUNTER — Ambulatory Visit (INDEPENDENT_AMBULATORY_CARE_PROVIDER_SITE_OTHER): Admitting: Family Medicine

## 2017-01-08 VITALS — BP 110/70 | HR 76 | Temp 98.4°F | Ht 64.0 in | Wt 181.1 lb

## 2017-01-08 DIAGNOSIS — G8929 Other chronic pain: Secondary | ICD-10-CM | POA: Diagnosis not present

## 2017-01-08 DIAGNOSIS — S76012A Strain of muscle, fascia and tendon of left hip, initial encounter: Secondary | ICD-10-CM | POA: Diagnosis not present

## 2017-01-08 DIAGNOSIS — M76892 Other specified enthesopathies of left lower limb, excluding foot: Secondary | ICD-10-CM | POA: Diagnosis not present

## 2017-01-08 DIAGNOSIS — M25562 Pain in left knee: Secondary | ICD-10-CM

## 2017-01-08 MED ORDER — MELOXICAM 15 MG PO TABS: 15.0000 mg | ORAL_TABLET | Freq: Every day | ORAL | 0 refills | Status: DC

## 2017-01-08 MED FILL — MELOXICAM 15 MG TABLET: 15 | 30 days supply | Qty: 30 | Fill #0

## 2017-01-08 NOTE — Progress Notes (Signed)
Pre visit review using our clinic review tool, if applicable. No additional management support is needed unless otherwise documented below in the visit note. 

## 2017-01-08 NOTE — Progress Notes (Signed)
Musculoskeletal Exam  Patient: [name] DOB: 23-Dec-1975  DOS: 01/08/2017  SUBJECTIVE:  Chief Complaint:   Chief Complaint  Patient presents with  . Knee Pain  . Hip Pain    [name] is a 41 y.o.  female for evaluation and treatment of L hip pain.   Onset:  3 weeks ago. Fairly sudden, no change in activity or injury.  Location: Triangle region over L hip, feels deep Character:  dull ache  Progression of issue:  has worsened slightly Associated symptoms: She also has chronic L knee pain  Treatment: to date has been rest.   Neurovascular symptoms: no  ROS: Musculoskeletal/Extremities: +L hip pain Neurologic: no numbness, tingling no weakness   Past Medical History:  Diagnosis Date  . Asthma   . Ovarian cyst rupture 09/08/2012   left   Past Surgical History:  Procedure Laterality Date  . APPENDECTOMY    . OVARIAN CYST REMOVAL Right 09/08/2012   drained and removed   Family History  Problem Relation Age of Onset  . Cancer Neg Hx    Current Outpatient Prescriptions  Medication Sig Dispense Refill  . Norethindrone Acetate-Ethinyl Estradiol (JUNEL 1.5/30) 1.5-30 MG-MCG tablet Take 1 tablet by mouth daily. 21 tablet 12   Allergies  Allergen Reactions  . Acetaminophen Hives   Social History   Social History  . Marital status: Married    Spouse name: Sofie Schendel.   . 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 Smoking status: Never Smoker  . Smokeless tobacco: Never Used  . Alcohol use No  . Drug use: No  . Sexual activity: Yes    Partners: Male    Birth control/ protection: Pill    Objective: VITAL SIGNS: BP 110/70 (BP Location: Left Arm, Patient Position: Sitting, Cuff Size: Normal)   Pulse 76   Temp 98.4 F (36.9 C) (Oral)   Ht 5\' 4"  (1.626 m)   Wt 181 lb 2 oz (82.2 kg)   SpO2 98%   BMI 31.09 kg/m  Constitutional: Well formed, well developed. No acute  distress. Thorax & Lungs: No accessory muscle use Extremities: No clubbing. No cyanosis. No edema.  Skin: Warm. Dry. No erythema. No rash.  Musculoskeletal: L hip.   Normal active range of motion: yes.   Normal passive range of motion: yes Tenderness to palpation: TTP over hip flexors and glute Pain elicited with resisted hip extension Deformity: no Ecchymosis: no Tests positive: Stinchfield Tests negative: Log roll, FABER, FADDIR, Ober's L knee- no effusion or TTP; +patellar apprehension Neurologic: Normal sensory function.  Psychiatric: Normal mood. Age appropriate judgment and insight. Alert & oriented x 3.    Assessment:  Chronic pain of left knee - Plan: Ambulatory referral to Physical Therapy, meloxicam (MOBIC) 15 MG tablet  Hip flexor tendinitis, left - Plan: Ambulatory referral to Physical Therapy, meloxicam (MOBIC) 15 MG tablet  Muscle strain of gluteal region, left, initial encounter - Plan: Ambulatory referral to Physical Therapy, meloxicam (MOBIC) 15 MG tablet  Plan: Orders as above. Heat. Ice. Home stretches and exercises. Cannot take Tylenol. F/u pending results with PT. Will consider injection for knee vs referral.  The patient voiced understanding and agreement to the plan.   [place], DO 01/08/17  8:18 AM

## 2017-01-08 NOTE — Patient Instructions (Addendum)
Heat (pad or rice pillow in microwave) over affected area, 10-15 minutes every 2-3 hours while awake.   Ice/cold pack over area for 10-15 min every 2-3 hours while awake.  Stretching and range of motion exercise This exercise warms up your muscles and joints and improves the movement and flexibility of your hip and pelvis. This exercise also helps to relieve pain and stiffness. Exercise A: Lunge ( hip flexor stretch)    1. Kneel on the floor on your left / right knee. Bend your other knee so it is directly over your ankle. 2. Keep good posture with your head over your shoulders. Tuck your tailbone underneath you. This will prevent your back from arching too much. 3. You should feel a gentle stretch in the front of your thigh or hip. If you do not feel a stretch, slowly lunge forward with your chest up. 4. Hold this position for 20 seconds. 5. Slowly return to the starting position. Repeat 4-5 times. Complete this exercise 2 times a day. Strengthening exercises These exercises build strength and endurance in your hip and pelvis. Endurance is the ability to use your muscles for a long time, even after they get tired. Exercise B: Bridge ( hip extensors)    1. Lie on your back on a firm surface with your knees bent and your feet flat on the floor. 2. Tighten your buttocks muscles and lift your bottom off the floor until the trunk of your body is level with your thighs. ? You should feel the muscles working in your buttocks and the back of your thighs. If this exercise is too easy, cross your arms over your chest or lift one leg while your bottom is up off the floor. ? Do not arch your back. 3. Hold this position for 10-15 seconds. 4. Slowly lower your hips to the starting position. 5. Let your muscles relax completely between repetitions. Repeat 2 times. Complete this exercise once a day. Exercise C: Straight leg raises ( hip abductors)    1. Lie on your side with your left / right  leg in the top position. Lie so your head, shoulder, knee, and hip line up. Bend your bottom knee to help you balance. 2. Lift your top leg up 4-6 inches (10-15 cm), keeping your toes pointed straight ahead. 3. Hold this position for 10 seconds. 4. Slowly lower your leg to the starting position and let your muscles relax completely. Repeat 2 times. Complete this exercise once a day. Exercise D: Hip abductors and external rotators, quadruped 1. Get on your hands and knees on a firm, lightly padded surface. Your hands should be directly below your shoulders, and your knees should be directly below your hips. 2. Lift your left / right knee out to the side. Keep your knee bent. Do not twist your body. 3. Hold this position for 10-15 seconds. 4. Slowly lower your leg. Repeat 2 times. Complete this exercise once a day. Exercise E: Single leg stand 1. Stand near a counter or door frame to hold onto as needed. It is helpful to look in a mirror for this exercise so you can watch your hip. 2. Squeeze your left / right buttock muscles then lift up your other foot. Do not let your left / righthip push out to the side. 3. Hold this position for 15-20 seconds. Repeat 2 times. Complete this exercise once  day. This information is not intended to replace advice given to you by your health care provider.  Make sure you discuss any questions you have with your health care provider. Document Released: 04/27/2005 Document Revised: 01/02/2016 Document Reviewed: 04/09/2015 Elsevier Interactive Patient Education  [employer].

## 2017-01-18 ENCOUNTER — Ambulatory Visit: Attending: Family Medicine | Admitting: Physical Therapy

## 2017-01-18 ENCOUNTER — Encounter: Payer: Self-pay | Admitting: Physical Therapy

## 2017-01-18 DIAGNOSIS — G8929 Other chronic pain: Secondary | ICD-10-CM

## 2017-01-18 DIAGNOSIS — M25552 Pain in left hip: Secondary | ICD-10-CM | POA: Diagnosis present

## 2017-01-18 DIAGNOSIS — M25562 Pain in left knee: Secondary | ICD-10-CM | POA: Insufficient documentation

## 2017-01-18 DIAGNOSIS — R262 Difficulty in walking, not elsewhere classified: Secondary | ICD-10-CM | POA: Diagnosis present

## 2017-01-19 DIAGNOSIS — M25562 Pain in left knee: Secondary | ICD-10-CM | POA: Diagnosis not present

## 2017-01-19 NOTE — Therapy (Signed)
[facility] [place], [place], [address] Phone: [phone]   Fax:  [phone]  Physical Therapy Evaluation  Patient Details  Name: [name] MRN: [id number] Date of Birth: 1975/12/03 Referring Provider: DO Laney Pastor   Encounter Date: 01/18/2017      PT End of Session - 01/19/17 1632    Visit Number 1   Number of Visits 16   Date for PT Re-Evaluation 03/16/17   PT Start Time 7035   PT Stop Time 0093   PT Time Calculation (min) 44 min   Activity Tolerance Patient tolerated treatment well   Behavior During Therapy [facility] for tasks assessed/performed      Past Medical History:  Diagnosis Date  . Asthma   . Ovarian cyst rupture 09/08/2012   left    Past Surgical History:  Procedure Laterality Date  . APPENDECTOMY    . OVARIAN CYST REMOVAL Right 09/08/2012   drained and removed    There were no vitals filed for this visit.       Subjective Assessment - 01/18/17 1638    Subjective Patient is a 41 year old female with left hip and knee pain. The knee pain was from several years ago. The hip pain has started recently. 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 < 30 minutes    How long can you walk comfortably? pain increases with distance in the knee and hip    Currently in Pain? Yes   Pain Score 6    Pain Location Knee   Pain Orientation Left   Pain Descriptors / Indicators Aching   Pain Type Chronic pain   Pain Onset More than a month ago   Pain Frequency Constant   Aggravating Factors  running, bending, stairs    Pain Relieving Factors elevation   Effect of Pain on Daily Activities Difficulty perfroming daily tasks    Multiple Pain Sites Yes            [facility] PT Assessment - 01/19/17 0001      Assessment   Medical Diagnosis Left knee pain    Referring Provider DO Crosby Oyster [place] Right   Prior Therapy 2002 had therapy in the  knee     Precautions   Precautions None     Restrictions   Weight Bearing Restrictions No     Balance Screen   Has the patient fallen in the past 6 months No   Has the patient had a decrease in activity level because of a fear of falling?  No   Is the patient reluctant to leave their home because of a fear of falling?  No     Home Environment   Additional Comments Stairs where she is living      Prior Function   Level of Independence Independent   Vocation Full time employment   Psychologist, prison and probation services    Leisure Soccer;      Cognition   Overall Cognitive Status Within Functional Limits for tasks assessed   Attention Focused   Focused Attention Appears intact   Memory Appears intact   Awareness Appears intact   Problem Solving Appears intact     Observation/Other Assessments   Focus on Therapeutic Outcomes (FOTO)  Not given by front      Sensation   Light Touch Appears Intact   Additional Comments denies parathesias  Coordination   Gross Motor Movements are Fluid and Coordinated Yes   Fine Motor Movements are Fluid and Coordinated Yes     AROM   Overall AROM Comments pain with end range hip flexion and extension; pain with left knee flexion      PROM   Overall PROM Comments pain with end range PROM      Strength   Right Hip Flexion 5/5   Right Hip Extension 5/5   Right Hip ABduction 5/5   Right Hip ADduction 5/5   Left Hip Flexion 3+/5   Left Hip Extension 3+/5   Left Hip ABduction 3+/5     Palpation   Palpation comment tenderness to palpation in the anterior hip and medial/lateral patella. Tendereness to palpation around the [facility]       Special Tests    Special Tests Knee Special Tests     Patellofemoral Apprehension Test    Comments (+) on the left      other    Comments Medial stress test (+)      Ambulation/Gait   Gait Comments decreased single leg stance time on the right; pronation on the left > right              Objective measurements completed on examination: See above findings.          [facility] Adult PT Treatment/Exercise - 01/19/17 0001      Exercises   Exercises Knee/Hip     Knee/Hip Exercises: Stretches   Active Hamstring Stretch Limitations 3x30 sec left    Quad Stretch Limitations thomas stretch 3x30 sec hold      Knee/Hip Exercises: Supine   Quad Sets Limitations 2x10 with towel    Bridges Limitations x10    Straight Leg Raises Limitations 2x5     Manual Therapy   Manual Therapy Taping   McConnell Medial taping of the patella. Improved pain noted.                 PT Education - 01/19/17 1601    Education provided (P)  Yes   Person(s) Educated (P)  Patient   Methods (P)  Explanation;Demonstration;Tactile cues;Verbal cues;Handout   Comprehension (P)  Verbalized understanding;Returned demonstration;Verbal cues required;Tactile cues required;Need further instruction          PT Short Term Goals - 01/19/17 1655      PT SHORT TERM GOAL #1   Title Patient will increase left gross LE strength to 4+/5    Time 4   Period Weeks   Status New     PT SHORT TERM GOAL #2   Title Patient will be independent with basic HEP    Time 4   Period Weeks   Status New     PT SHORT TERM GOAL #3   Title Patient will be independent with taping    Time 4   Period Weeks   Status New     PT SHORT TERM GOAL #4   Title Patient will demsotrate full pain free PROM    Time 4   Period Weeks   Status New           PT Long Term Goals - 01/19/17 1656      PT LONG TERM GOAL #1   Title Patient will ambulate go up/down steps without buckling or pain in order to perfrom dialy activity    Time 8   Period Weeks   Status New     PT LONG TERM GOAL #2  Title Patient will ambualte 1 mile without self report of pain    Time 8   Period Weeks   Status New     PT LONG TERM GOAL #3   Title Patient will stand for 1 hour at work without increased pain    Time 8    Period Weeks   Status New                Plan - 01/19/17 1636    Clinical Impression Statement Patient is a 41 year old female who presents with left knee pain and left anterior hip pain. Her knee pain has been around fro several years. Her hip pain has began more recently. She has increased pain with standing, walking and running. Signs and symptoms are consitetn with hip flexor tendinitis on the left and patellafemoral disorder of her left knee. She has signficiant hip flexor/ abductor/ extensor pain on the left. She has pain with end range flexion of the left knee. She would benefitfrom skilled therapy to improve strength and stability of the left lower extremity. Therapy also reviewed taping with the patient.    Clinical Presentation Evolving   Clinical Decision Making Moderate   Rehab Potential Good   PT Frequency 2x / week   PT Duration 8 weeks   PT Treatment/Interventions ADLs/Self Care Home Management;Cryotherapy;Electrical Stimulation;Iontophoresis 4mg /ml Dexamethasone;Gait training;Stair training;Therapeutic activities;Therapeutic exercise;Neuromuscular re-education;Patient/family education;Manual techniques;Splinting;Taping;Passive range of motion;Moist Heat;Ultrasound;Dry needling   PT Next Visit Plan consider 3 way straight leg raise; review bridging; add standing heel raise; add single leg stance; consdier ionto to hip fleor tendon; consdier IT band roll out; review hamstring stretching; consdier IT band stretching;    PT Home Exercise Plan quad set; SLR; bridging; hamstring stretch; thomas stretch    Consulted and Agree with Plan of Care Patient      Patient will benefit from skilled therapeutic intervention in order to improve the following deficits and impairments:  Pain, Decreased strength, Decreased knowledge of use of DME, Decreased activity tolerance, Difficulty walking, Decreased range of motion, Decreased safety awareness, Impaired UE functional use  Visit  Diagnosis: Chronic pain of left knee - Plan: PT plan of care cert/re-cert  Pain in left hip - Plan: PT plan of care cert/re-cert  Difficulty in walking, not elsewhere classified - Plan: PT plan of care cert/re-cert      G-Codes - 63/14/97 1702    Functional Limitation Mobility: Walking and moving around   Mobility: Walking and Moving Around Current Status [phone]) At least 40 percent but less than 60 percent impaired, limited or restricted   Mobility: Walking and Moving Around Goal Status [phone]) At least 1 percent but less than 20 percent impaired, limited or restricted       Problem List Patient Active Problem List   Diagnosis Date Noted  . Abnormal uterine bleeding (AUB) 07/11/2013  . Vaginitis and vulvovaginitis, unspecified 07/11/2013  . Postoperative wound infection 10/13/2012  . Other and unspecified ovarian cyst 09/28/2012  . Appendicitis 09/28/2012  . S/P appendectomy 09/13/2012  . S/P ovarian cystectomy 09/13/2012    Carney Living PT DPT  01/19/2017, 5:22 PM  [facility] [address] [place], [place], [address] Phone: [phone]   Fax:  [phone]  Name: [name] MRN: [id number] Date of Birth: 20-Jun-1975

## 2017-01-25 ENCOUNTER — Ambulatory Visit: Admitting: Physical Therapy

## 2017-01-25 DIAGNOSIS — M25562 Pain in left knee: Principal | ICD-10-CM

## 2017-01-25 DIAGNOSIS — M25552 Pain in left hip: Secondary | ICD-10-CM

## 2017-01-25 DIAGNOSIS — R262 Difficulty in walking, not elsewhere classified: Secondary | ICD-10-CM

## 2017-01-25 DIAGNOSIS — G8929 Other chronic pain: Secondary | ICD-10-CM

## 2017-01-26 NOTE — Therapy (Signed)
[place], [place], [address] Phone: [phone]   Fax:  [phone]  Physical Therapy Treatment  Patient Details  Name: [name] MRN: [id number] Date of Birth: Nov 08, 1975 Referring Provider: DO Laney Pastor   Encounter Date: 01/25/2017      PT End of Session - 01/26/17 1117    Visit Number 2   Number of Visits 16   Date for PT Re-Evaluation 03/16/17   PT Start Time 1636   PT Stop Time 1732   PT Time Calculation (min) 56 min   Activity Tolerance Patient tolerated treatment well   Behavior During Therapy [facility] for tasks assessed/performed      Past Medical History:  Diagnosis Date  . Asthma   . Ovarian cyst rupture 09/08/2012   left    Past Surgical History:  Procedure Laterality Date  . APPENDECTOMY    . OVARIAN CYST REMOVAL Right 09/08/2012   drained and removed    There were no vitals filed for this visit.      Subjective Assessment - 01/25/17 1641    Subjective Patient reports minor soreness after the IE. She felt some relief with the tape. Her hip had felt better but yesterday she began having a bit of sharp pain in her hip.    How long can you stand comfortably? < 30 minutes    How long can you walk comfortably? pain increases with distance in the knee and hip    Currently in Pain? Yes   Pain Score 7    Pain Location Knee   Pain Orientation Left   Pain Descriptors / Indicators Aching   Pain Type Chronic pain   Pain Onset More than a month ago   Pain Frequency Constant   Aggravating Factors  running, bending, stairs    Pain Relieving Factors elevation and rest    Effect of Pain on Daily Activities difficulty perfroming daily tasks    Pain Score 4   Pain Location Hip   Pain Orientation Left   Pain Descriptors / Indicators Aching   Pain Type Chronic pain   Pain Onset More than a month ago   Pain Frequency Constant   Aggravating Factors  standing and walking     Pain Relieving Factors rest                         [facility] Adult PT Treatment/Exercise - 01/26/17 0001      Self-Care   Self-Care Other Self-Care Comments   Other Self-Care Comments  reviewed self taping. Patient able to tape her knee with min cuing.      Knee/Hip Exercises: Stretches   Active Hamstring Stretch Limitations 3x30 sec left    Quad Stretch Limitations thomas stretch 3x30 sec hold      Knee/Hip Exercises: Machines for Strengthening   Total Gym Leg Press 2x10 40 lbs cuing to avoid terminal extension     Knee/Hip Exercises: Standing   Heel Raises Limitations x20      Knee/Hip Exercises: Supine   Quad Sets Limitations 2x10 with towel    Short Arc Quad Sets Limitations 3x10    Bridges Limitations 2x10    Straight Leg Raises Limitations x10 and x8      Modalities   Modalities Cryotherapy     Cryotherapy   Number Minutes Cryotherapy 10 Minutes   Cryotherapy Location Knee   Type of Cryotherapy Ice pack     Manual  Therapy   Manual Therapy Taping                PT Education - 01/25/17 1645    Education provided Yes   Person(s) Educated Patient   Methods Explanation;Demonstration;Tactile cues;Verbal cues   Comprehension Verbalized understanding;Returned demonstration;Verbal cues required;Tactile cues required          PT Short Term Goals - 01/19/17 1655      PT SHORT TERM GOAL #1   Title Patient will increase left gross LE strength to 4+/5    Time 4   Period Weeks   Status New     PT SHORT TERM GOAL #2   Title Patient will be independent with basic HEP    Time 4   Period Weeks   Status New     PT SHORT TERM GOAL #3   Title Patient will be independent with taping    Time 4   Period Weeks   Status New     PT SHORT TERM GOAL #4   Title Patient will demsotrate full pain free PROM    Time 4   Period Weeks   Status New           PT Long Term Goals - 01/19/17 1656      PT LONG TERM GOAL #1   Title Patient will  ambulate go up/down steps without buckling or pain in order to perfrom dialy activity    Time 8   Period Weeks   Status New     PT LONG TERM GOAL #2   Title Patient will ambualte 1 mile without self report of pain    Time 8   Period Weeks   Status New     PT LONG TERM GOAL #3   Title Patient will stand for 1 hour at work without increased pain    Time 8   Period Weeks   Status New               Plan - 01/26/17 1118    Clinical Impression Statement Patient tolerated treatment well. She demonstrated improved ability to perform straight leg raise today. She had no increase in pain with treatment. Therapy advised patient to continue with current POC.    Clinical Presentation Evolving   Clinical Decision Making Moderate   Rehab Potential Good   PT Frequency 2x / week   PT Duration 8 weeks   PT Treatment/Interventions ADLs/Self Care Home Management;Cryotherapy;Electrical Stimulation;Iontophoresis 4mg /ml Dexamethasone;Gait training;Stair training;Therapeutic activities;Therapeutic exercise;Neuromuscular re-education;Patient/family education;Manual techniques;Splinting;Taping;Passive range of motion;Moist Heat;Ultrasound;Dry needling   PT Next Visit Plan consider 3 way straight leg raise; review bridging; add standing heel raise; add single leg stance; consdier ionto to hip fleor tendon; consdier IT band roll out; review hamstring stretching; consdier IT band stretching;    PT Home Exercise Plan quad set; SLR; bridging; hamstring stretch; thomas stretch    Consulted and Agree with Plan of Care Patient      Patient will benefit from skilled therapeutic intervention in order to improve the following deficits and impairments:  Pain, Decreased strength, Decreased knowledge of use of DME, Decreased activity tolerance, Difficulty walking, Decreased range of motion, Decreased safety awareness, Impaired UE functional use  Visit Diagnosis: Chronic pain of left knee  Pain in left  hip  Difficulty in walking, not elsewhere classified     Problem List Patient Active Problem List   Diagnosis Date Noted  . Abnormal uterine bleeding (AUB) 07/11/2013  . Vaginitis and vulvovaginitis, unspecified 07/11/2013  .  Postoperative wound infection 10/13/2012  . Other and unspecified ovarian cyst 09/28/2012  . Appendicitis 09/28/2012  . S/P appendectomy 09/13/2012  . S/P ovarian cystectomy 09/13/2012    Carney Living PT DPT  01/26/2017, 1:11 PM  [facility] [address] [place], [place], [address] Phone: [phone]   Fax:  [phone]  Name: SHALLON YAKLIN MRN: [id number] Date of Birth: 08/24/1975

## 2017-01-28 ENCOUNTER — Ambulatory Visit: Admitting: Physical Therapy

## 2017-01-28 DIAGNOSIS — M25562 Pain in left knee: Secondary | ICD-10-CM | POA: Diagnosis not present

## 2017-01-28 DIAGNOSIS — G8929 Other chronic pain: Secondary | ICD-10-CM

## 2017-01-28 DIAGNOSIS — R262 Difficulty in walking, not elsewhere classified: Secondary | ICD-10-CM

## 2017-01-28 DIAGNOSIS — M25552 Pain in left hip: Secondary | ICD-10-CM

## 2017-01-29 NOTE — Therapy (Signed)
[place], [place], [address] Phone: [phone]   Fax:  [phone]  Physical Therapy Evaluation  Patient Details  Name: [name] MRN: [id number] Date of Birth: 1975-08-26 Referring Provider: DO Laney Pastor   Encounter Date: 01/28/2017      PT End of Session - 01/28/17 1643    Visit Number 2   Number of Visits 16   Date for PT Re-Evaluation 03/16/17   PT Start Time 1633   PT Stop Time 1716   PT Time Calculation (min) 43 min   Activity Tolerance Patient tolerated treatment well   Behavior During Therapy [facility] for tasks assessed/performed      Past Medical History:  Diagnosis Date  . Asthma   . Ovarian cyst rupture 09/08/2012   left    Past Surgical History:  Procedure Laterality Date  . APPENDECTOMY    . OVARIAN CYST REMOVAL Right 09/08/2012   drained and removed    There were no vitals filed for this visit.       Subjective Assessment - 01/28/17 1638    Subjective Patient reports she was active yesterday and her knee is a little more sore today. Overall she reports her knee is nobetter or no worse at this time. Her hip is feeling better. She has been able to do her exercises without major increase in pain.    How long can you stand comfortably? < 30 minutes    How long can you walk comfortably? pain increases with distance in the knee and hip    Currently in Pain? Yes   Pain Score 7    Pain Location Knee   Pain Orientation Left   Pain Descriptors / Indicators Aching   Pain Type Chronic pain   Pain Onset More than a month ago   Pain Frequency Rarely   Aggravating Factors  running, beding, stairs    Pain Relieving Factors elvevation and rest    Effect of Pain on Daily Activities difficulty perfroming daily tasks    Multiple Pain Sites No   Pain Score 0   Pain Orientation Left   Pain Descriptors / Indicators Aching   Pain Type Chronic pain   Pain Onset More than a  month ago   Pain Frequency Constant   Aggravating Factors  standing and walking    Pain Relieving Factors rest                Objective measurements completed on examination: See above findings.          [facility] Adult PT Treatment/Exercise - 01/29/17 0001      Knee/Hip Exercises: Stretches   Active Hamstring Stretch Limitations 3x30 sec left    Quad Stretch Limitations thomas stretch 3x30 sec hold      Knee/Hip Exercises: Machines for Strengthening   Total Gym Leg Press 2x10 40 lbs cuing to avoid terminal extension     Knee/Hip Exercises: Standing   Heel Raises Limitations x20    Knee Flexion Limitations standing march 2x10    Abduction Limitations 2x10 left    Extension Limitations 2x10 left                 PT Education - 01/28/17 1642    Education provided Yes   Education Details reviewed HEP; added standing exercises.    Person(s) Educated Patient   Methods Explanation;Demonstration;Tactile cues;Verbal cues   Comprehension Verbalized understanding;Returned demonstration;Verbal cues required;Tactile cues required  PT Short Term Goals - 01/19/17 1655      PT SHORT TERM GOAL #1   Title Patient will increase left gross LE strength to 4+/5    Time 4   Period Weeks   Status New     PT SHORT TERM GOAL #2   Title Patient will be independent with basic HEP    Time 4   Period Weeks   Status New     PT SHORT TERM GOAL #3   Title Patient will be independent with taping    Time 4   Period Weeks   Status New     PT SHORT TERM GOAL #4   Title Patient will demsotrate full pain free PROM    Time 4   Period Weeks   Status New           PT Long Term Goals - 01/19/17 1656      PT LONG TERM GOAL #1   Title Patient will ambulate go up/down steps without buckling or pain in order to perfrom dialy activity    Time 8   Period Weeks   Status New     PT LONG TERM GOAL #2   Title Patient will ambualte 1 mile without self report of pain     Time 8   Period Weeks   Status New     PT LONG TERM GOAL #3   Title Patient will stand for 1 hour at work without increased pain    Time 8   Period Weeks   Status New                Plan - 01/29/17 1123    Clinical Impression Statement Added left standing exercises and also addedweights to straihgt leg raise. The patient had no significant increase in pain. Overall she is making progress. She had no increase in  ain with treatment. Per visual inspection her strength appears to be improving.    Clinical Presentation Stable   Clinical Decision Making Moderate   Rehab Potential Good   PT Frequency 2x / week   PT Duration 8 weeks   PT Treatment/Interventions ADLs/Self Care Home Management;Cryotherapy;Electrical Stimulation;Iontophoresis 4mg /ml Dexamethasone;Gait training;Stair training;Therapeutic activities;Therapeutic exercise;Neuromuscular re-education;Patient/family education;Manual techniques;Splinting;Taping;Passive range of motion;Moist Heat;Ultrasound;Dry needling   PT Next Visit Plan consider 3 way straight leg raise; review bridging; add standing heel raise; add single leg stance; consdier ionto to hip fleor tendon; consdier IT band roll out; review hamstring stretching; consdier IT band stretching;    PT Home Exercise Plan quad set; SLR; bridging; hamstring stretch; thomas stretch    Consulted and Agree with Plan of Care Patient      Patient will benefit from skilled therapeutic intervention in order to improve the following deficits and impairments:  Pain, Decreased strength, Decreased knowledge of use of DME, Decreased activity tolerance, Difficulty walking, Decreased range of motion, Decreased safety awareness, Impaired UE functional use  Visit Diagnosis: Chronic pain of left knee  Pain in left hip  Difficulty in walking, not elsewhere classified     Problem List Patient Active Problem List   Diagnosis Date Noted  . Abnormal uterine bleeding (AUB)  07/11/2013  . Vaginitis and vulvovaginitis, unspecified 07/11/2013  . Postoperative wound infection 10/13/2012  . Other and unspecified ovarian cyst 09/28/2012  . Appendicitis 09/28/2012  . S/P appendectomy 09/13/2012  . S/P ovarian cystectomy 09/13/2012    Carney Living PT DPT  01/29/2017, 11:29 AM  [facility] [phone]  [place], [place], [address] Phone: [phone]   Fax:  [phone]  Name: [name] MRN: [id number] Date of Birth: [birth year]

## 2017-02-01 ENCOUNTER — Encounter: Payer: Self-pay | Admitting: Physical Therapy

## 2017-02-01 ENCOUNTER — Ambulatory Visit: Admitting: Physical Therapy

## 2017-02-01 DIAGNOSIS — M25562 Pain in left knee: Secondary | ICD-10-CM | POA: Diagnosis not present

## 2017-02-01 DIAGNOSIS — G8929 Other chronic pain: Secondary | ICD-10-CM

## 2017-02-01 DIAGNOSIS — M25552 Pain in left hip: Secondary | ICD-10-CM

## 2017-02-02 ENCOUNTER — Encounter: Payer: Self-pay | Admitting: Physical Therapy

## 2017-02-02 NOTE — Therapy (Signed)
[place], [place], [address] Phone: [phone]   Fax:  [phone]  Physical Therapy Treatment  Patient Details  Name: [name] MRN: [id number] Date of Birth: 05-31-1975 Referring Provider: DO Laney Pastor   Encounter Date: 02/01/2017      PT End of Session - 02/01/17 1644    Visit Number 3   Number of Visits 16   Date for PT Re-Evaluation 03/16/17   PT Start Time 1634   PT Stop Time 1716   PT Time Calculation (min) 42 min   Activity Tolerance Patient tolerated treatment well   Behavior During Therapy [facility] for tasks assessed/performed      Past Medical History:  Diagnosis Date  . Asthma   . Ovarian cyst rupture 09/08/2012   left    Past Surgical History:  Procedure Laterality Date  . APPENDECTOMY    . OVARIAN CYST REMOVAL Right 09/08/2012   drained and removed    There were no vitals filed for this visit.      Subjective Assessment - 02/01/17 1639    Subjective Patient reports her knee has felt pretty good. Hewr hip has been sore. She has been active over the past few days.    How long can you stand comfortably? < 30 minutes    How long can you walk comfortably? pain increases with distance in the knee and hip    Currently in Pain? Yes   Pain Score 4    Pain Location Knee   Pain Orientation Left   Pain Descriptors / Indicators Aching   Pain Type Chronic pain   Pain Onset More than a month ago   Pain Frequency Rarely   Aggravating Factors  running, be bding    Pain Relieving Factors evlevation and rest    Effect of Pain on Daily Activities difficulty perfroming daily tasks    Multiple Pain Sites Yes   Pain Score 5   Pain Location Hip   Pain Orientation Left   Pain Descriptors / Indicators Aching   Pain Type Chronic pain   Pain Onset More than a month ago   Pain Frequency Constant   Aggravating Factors  standing and walking    Pain Relieving Factors rest                           [facility] Adult PT Treatment/Exercise - 02/02/17 0001      Knee/Hip Exercises: Stretches   Active Hamstring Stretch Limitations 3x30 sec left    Quad Stretch Limitations thomas stretch 3x30 sec hold      Knee/Hip Exercises: Machines for Strengthening   Total Gym Leg Press 2x10 40 lbs cuing to avoid terminal extension     Knee/Hip Exercises: Standing   Heel Raises Limitations x20    Knee Flexion Limitations standing march 2x10    Abduction Limitations 2x10 left    Extension Limitations 2x10 left    SLS 3x15 sec with min hold      Knee/Hip Exercises: Supine   Quad Sets Limitations 2x10 with ball    Short Arc Quad Sets Limitations 3x10 1lb weight    Bridges Limitations 2x10    Straight Leg Raises Limitations 2x10 ilb weight      Modalities   Modalities --  declined ice                 PT Education - 02/01/17 1644    Education  provided Yes   Education Details improitance of stretching    Person(s) Educated Patient   Methods Explanation;Demonstration;Tactile cues;Verbal cues   Comprehension Verbalized understanding;Returned demonstration;Verbal cues required;Tactile cues required          PT Short Term Goals - 01/19/17 1655      PT SHORT TERM GOAL #1   Title Patient will increase left gross LE strength to 4+/5    Time 4   Period Weeks   Status New     PT SHORT TERM GOAL #2   Title Patient will be independent with basic HEP    Time 4   Period Weeks   Status New     PT SHORT TERM GOAL #3   Title Patient will be independent with taping    Time 4   Period Weeks   Status New     PT SHORT TERM GOAL #4   Title Patient will demsotrate full pain free PROM    Time 4   Period Weeks   Status New           PT Long Term Goals - 01/19/17 1656      PT LONG TERM GOAL #1   Title Patient will ambulate go up/down steps without buckling or pain in order to perfrom dialy activity    Time 8   Period Weeks   Status New      PT LONG TERM GOAL #2   Title Patient will ambualte 1 mile without self report of pain    Time 8   Period Weeks   Status New     PT LONG TERM GOAL #3   Title Patient will stand for 1 hour at work without increased pain    Time 8   Period Weeks   Status New               Plan - 02/02/17 1437    Clinical Impression Statement Patient is making good progress.  Therapy was able to add single leg stance to treatment. The patient had no increase in pain with single leg stance. She was encouraged to continue with exercises and stretching.    Clinical Presentation Stable   Clinical Decision Making Moderate   Rehab Potential Good   PT Frequency 2x / week   PT Duration 8 weeks   PT Treatment/Interventions ADLs/Self Care Home Management;Cryotherapy;Electrical Stimulation;Iontophoresis 4mg /ml Dexamethasone;Gait training;Stair training;Therapeutic activities;Therapeutic exercise;Neuromuscular re-education;Patient/family education;Manual techniques;Splinting;Taping;Passive range of motion;Moist Heat;Ultrasound;Dry needling   PT Next Visit Plan consider 3 way straight leg raise; review bridging; add standing heel raise; add single leg stance; consdier ionto to hip fleor tendon; consdier IT band roll out; review hamstring stretching; consdier IT band stretching; Review tolerance to single leg stance.    PT Home Exercise Plan quad set; SLR; bridging; hamstring stretch; thomas stretch    Consulted and Agree with Plan of Care Patient      Patient will benefit from skilled therapeutic intervention in order to improve the following deficits and impairments:  Pain, Decreased strength, Decreased knowledge of use of DME, Decreased activity tolerance, Difficulty walking, Decreased range of motion, Decreased safety awareness, Impaired UE functional use  Visit Diagnosis: Chronic pain of left knee  Pain in left hip     Problem List Patient Active Problem List   Diagnosis Date Noted  . Abnormal  uterine bleeding (AUB) 07/11/2013  . Vaginitis and vulvovaginitis, unspecified 07/11/2013  . Postoperative wound infection 10/13/2012  . Other and unspecified ovarian cyst 09/28/2012  . Appendicitis  09/28/2012  . S/P appendectomy 09/13/2012  . S/P ovarian cystectomy 09/13/2012    Carney Living 02/02/2017, 2:43 PM  [facility] [address] [place], [place], [address] Phone: [phone]   Fax:  [phone]  Name: [name] MRN: [id number] Date of Birth: 08/14/1975

## 2017-02-04 ENCOUNTER — Ambulatory Visit: Admitting: Physical Therapy

## 2017-02-04 DIAGNOSIS — R262 Difficulty in walking, not elsewhere classified: Secondary | ICD-10-CM

## 2017-02-04 DIAGNOSIS — M25552 Pain in left hip: Secondary | ICD-10-CM

## 2017-02-04 DIAGNOSIS — M25562 Pain in left knee: Principal | ICD-10-CM

## 2017-02-04 DIAGNOSIS — G8929 Other chronic pain: Secondary | ICD-10-CM

## 2017-02-05 NOTE — Therapy (Signed)
[place], [place], [address] Phone: [phone]   Fax:  [phone]  Physical Therapy Treatment  Patient Details  Name: [name] MRN: [id number] Date of Birth: 1976/04/01 Referring Provider: DO Laney Pastor   Encounter Date: 02/04/2017       PT End of Session - 02/04/17 1725    Visit Number 4   Number of Visits 16   Date for PT Re-Evaluation 03/16/17   PT Start Time 1720   PT Stop Time 1800   PT Time Calculation (min) 40 min   Activity Tolerance Patient tolerated treatment well   Behavior During Therapy [facility] for tasks assessed/performed      Past Medical History:  Diagnosis Date  . Asthma   . Ovarian cyst rupture 09/08/2012   left    Past Surgical History:  Procedure Laterality Date  . APPENDECTOMY    . OVARIAN CYST REMOVAL Right 09/08/2012   drained and removed    There were no vitals filed for this visit.      Subjective Assessment - 02/04/17 1723    Subjective Patient had knee pain this morning. She feels her knee pain today is about an 8/10. She feels like her hip is better. She can not rememeber doing anything the day before to make her pain worse.    How long can you stand comfortably? < 30 minutes    How long can you walk comfortably? pain increases with distance in the knee and hip    Currently in Pain? Yes   Pain Score 8    Pain Location Knee   Pain Orientation Left   Pain Descriptors / Indicators Aching   Pain Type Chronic pain   Pain Onset More than a month ago   Pain Frequency Rarely   Aggravating Factors  running    Pain Relieving Factors evelvation and rest    Effect of Pain on Daily Activities difficulty perfroming daily tasks    Multiple Pain Sites No                         [facility] Adult PT Treatment/Exercise - 02/05/17 0001      Knee/Hip Exercises: Stretches   Active Hamstring Stretch Limitations 3x30 sec left    Quad Stretch  Limitations thomas stretch 3x30 sec hold      Knee/Hip Exercises: Machines for Strengthening   Total Gym Leg Press 2x10 40 lbs cuing to avoid terminal extension     Knee/Hip Exercises: Standing   Heel Raises Limitations x20    Knee Flexion Limitations standing march 2x10    Abduction Limitations 2x10 left    Extension Limitations 2x10 left    SLS 3x15 sec with min hold      Knee/Hip Exercises: Supine   Quad Sets Limitations 2x10 with [place]well    Short Arc Quad Sets Limitations 3x10 1lb    Bridges Limitations 2x10    Straight Leg Raises Limitations 2x10 ilb weight      Modalities   Modalities --  declined ice                 PT Education - 02/04/17 1725    Education provided Yes   Education Details reviewed HEP    Person(s) Educated Patient   Methods Explanation;Demonstration;Tactile cues;Verbal cues   Comprehension Verbalized understanding;Returned demonstration;Verbal cues required;Tactile cues required          PT Short Term Goals - 02/04/17  [place] #1   Title Patient will increase left gross LE strength to 4+/5    Period Weeks   Status On-going     PT SHORT TERM GOAL #2   Title Patient will be independent with basic HEP    Time 4   Period Weeks   Status On-going     PT SHORT TERM GOAL #3   Title Patient will be independent with taping    Time 4   Period Weeks   Status On-going     PT SHORT TERM GOAL #4   Title Patient will demsotrate full pain free PROM    Time 4   Period Weeks   Status On-going           PT Long Term Goals - 01/19/17 1656      PT LONG TERM GOAL #1   Title Patient will ambulate go up/down steps without buckling or pain in order to perfrom dialy activity    Time 8   Period Weeks   Status New     PT LONG TERM GOAL #2   Title Patient will ambualte 1 mile without self report of pain    Time 8   Period Weeks   Status New     PT LONG TERM GOAL #3   Title Patient will stand for 1 hour at work  without increased pain    Time 8   Period Weeks   Status New               Plan - 02/04/17 1726    Clinical Impression Statement Patient continues to tolerate treatment well depsite pain levels reaching an 8/10 prior to treatment. She was encouraged to continue stretching and strengthening at home. Therapy reviewed standing exercises with the patient.    Clinical Presentation Stable   Clinical Decision Making Moderate   Rehab Potential Good   PT Frequency 2x / week   PT Duration 8 weeks   PT Treatment/Interventions ADLs/Self Care Home Management;Cryotherapy;Electrical Stimulation;Iontophoresis 4mg /ml Dexamethasone;Gait training;Stair training;Therapeutic activities;Therapeutic exercise;Neuromuscular re-education;Patient/family education;Manual techniques;Splinting;Taping;Passive range of motion;Moist Heat;Ultrasound;Dry needling   PT Next Visit Plan consider 3 way straight leg raise; review bridging; add standing heel raise; add single leg stance; consdier ionto to hip fleor tendon; consdier IT band roll out; review hamstring stretching; consdier IT band stretching; Review tolerance to single leg stance.    PT Home Exercise Plan quad set; SLR; bridging; hamstring stretch; thomas stretch    Consulted and Agree with Plan of Care Patient      Patient will benefit from skilled therapeutic intervention in order to improve the following deficits and impairments:  Pain, Decreased strength, Decreased knowledge of use of DME, Decreased activity tolerance, Difficulty walking, Decreased range of motion, Decreased safety awareness, Impaired UE functional use  Visit Diagnosis: Chronic pain of left knee  Pain in left hip  Difficulty in walking, not elsewhere classified     Problem List Patient Active Problem List   Diagnosis Date Noted  . Abnormal uterine bleeding (AUB) 07/11/2013  . Vaginitis and vulvovaginitis, unspecified 07/11/2013  . Postoperative wound infection 10/13/2012  .  Other and unspecified ovarian cyst 09/28/2012  . Appendicitis 09/28/2012  . S/P appendectomy 09/13/2012  . S/P ovarian cystectomy 09/13/2012    Carney Living PT DPT  02/05/2017, 7:51 AM  [facility] [address] [place], [place], [address] Phone: [phone]   Fax:  [phone]  Name:  [name] MRN: [id number] Date of Birth: 06-07-1975

## 2017-02-09 ENCOUNTER — Encounter: Payer: Self-pay | Admitting: Physical Therapy

## 2017-02-09 ENCOUNTER — Ambulatory Visit: Attending: Family Medicine | Admitting: Physical Therapy

## 2017-02-09 DIAGNOSIS — R262 Difficulty in walking, not elsewhere classified: Secondary | ICD-10-CM

## 2017-02-09 DIAGNOSIS — M25552 Pain in left hip: Secondary | ICD-10-CM

## 2017-02-09 DIAGNOSIS — M25562 Pain in left knee: Secondary | ICD-10-CM | POA: Diagnosis present

## 2017-02-09 DIAGNOSIS — G8929 Other chronic pain: Secondary | ICD-10-CM | POA: Diagnosis present

## 2017-02-09 NOTE — Therapy (Signed)
[place], [place], [address] Phone: [phone]   Fax:  [phone]  Physical Therapy Treatment  Patient Details  Name: [name] MRN: [id number] Date of Birth: [birth year] Referring Provider: DO Laney Pastor   Encounter Date: 02/09/2017      PT End of Session - 02/09/17 1636    Visit Number 5   Number of Visits 16   Date for PT Re-Evaluation 03/16/17   PT Start Time 1636  pt arrived late   PT Stop Time 1718   PT Time Calculation (min) 42 min   Activity Tolerance Patient tolerated treatment well   Behavior During Therapy [facility] for tasks assessed/performed      Past Medical History:  Diagnosis Date  . Asthma   . Ovarian cyst rupture 09/08/2012   left    Past Surgical History:  Procedure Laterality Date  . APPENDECTOMY    . OVARIAN CYST REMOVAL Right 09/08/2012   drained and removed    There were no vitals filed for this visit.      Subjective Assessment - 02/09/17 1638    Subjective Pt reports knee is about an 8/10. Aching around medial patellar line.    Currently in Pain? Yes   Pain Score 8                          [facility] Adult PT Treatment/Exercise - 02/09/17 0001      Knee/Hip Exercises: Supine   Other Supine Knee/Hip Exercises pelvic tilt with ball squeeze   Other Supine Knee/Hip Exercises roll up/roll down     Modalities   Modalities Ultrasound     Ultrasound   Ultrasound Location L knee   Ultrasound Parameters .8 w/cm2 pulsed, 8 min   Ultrasound Goals Pain     Manual Therapy   Manual Therapy Muscle Energy Technique   Muscle Energy Technique L hip flexors, R extensors                PT Education - 02/09/17 1723    Education provided Yes   Education Details pelvic rotation, posture at work &standing, how to train sit ups, self MET PRN   Person(s) Educated Patient   Methods Explanation;Demonstration;Tactile cues;Verbal cues;Handout    Comprehension Verbalized understanding;Returned demonstration;Verbal cues required;Tactile cues required;Need further instruction          PT Short Term Goals - 02/04/17 1727      PT SHORT TERM GOAL #1   Title Patient will increase left gross LE strength to 4+/5    Period Weeks   Status On-going     PT SHORT TERM GOAL #2   Title Patient will be independent with basic HEP    Time 4   Period Weeks   Status On-going     PT SHORT TERM GOAL #3   Title Patient will be independent with taping    Time 4   Period Weeks   Status On-going     PT SHORT TERM GOAL #4   Title Patient will demsotrate full pain free PROM    Time 4   Period Weeks   Status On-going           PT Long Term Goals - 01/19/17 1656      PT LONG TERM GOAL #1   Title Patient will ambulate go up/down steps without buckling or pain in order to perfrom dialy activity    Time 8  Period Weeks   Status New     PT LONG TERM GOAL #2   Title Patient will ambualte 1 mile without self report of pain    Time 8   Period Weeks   Status New     PT LONG TERM GOAL #3   Title Patient will stand for 1 hour at work without increased pain    Time 8   Period Weeks   Status New               Plan - 02/09/17 1728    Clinical Impression Statement Pt with L post innom rotation decreased with MET. Pt was able to lay supine without SI pain and could stand without feeling like knee was "snapping back"  Pt with notable core weakness and has to be able to do sit ups for [employer] test. Discussed importance of core strength for hip stability that provides support to knee joint. Discussed self correction PRN. Asked pt to sit with ball bw knees at work for stability and try to stand equally on feet.    PT Treatment/Interventions ADLs/Self Care Home Management;Cryotherapy;Electrical Stimulation;Iontophoresis 58m/ml Dexamethasone;Gait training;Stair training;Therapeutic activities;Therapeutic exercise;Neuromuscular  re-education;Patient/family education;Manual techniques;Splinting;Taping;Passive range of motion;Moist Heat;Ultrasound;Dry needling   PT Next Visit Plan core stability, L hip abduction strengthening, check pelvic alignment   PT Home Exercise Plan quad set; SLR; bridging; hamstring stretch; thomas stretch; hooklying pelvic tilt with ball squeeze, roll up/down   Consulted and Agree with Plan of Care Patient      Patient will benefit from skilled therapeutic intervention in order to improve the following deficits and impairments:  Pain, Decreased strength, Decreased knowledge of use of DME, Decreased activity tolerance, Difficulty walking, Decreased range of motion, Decreased safety awareness, Impaired UE functional use  Visit Diagnosis: Chronic pain of left knee  Difficulty in walking, not elsewhere classified  Pain in left hip     Problem List Patient Active Problem List   Diagnosis Date Noted  . Abnormal uterine bleeding (AUB) 07/11/2013  . Vaginitis and vulvovaginitis, unspecified 07/11/2013  . Postoperative wound infection 10/13/2012  . Other and unspecified ovarian cyst 09/28/2012  . Appendicitis 09/28/2012  . S/P appendectomy 09/13/2012  . S/P ovarian cystectomy 09/13/2012    Ioane Bhola C. Jarrad Mclees PT, DPT 02/09/17 5:32 PM   [facility]1959[address]GBelleme[place] [address]Phone: [phone]  Fax:  [phone] Name: Shelby LIGHTLEMRN: [id number] of Birth: 9Aug 18, 1977

## 2017-02-11 ENCOUNTER — Encounter: Payer: Self-pay | Admitting: Physical Therapy

## 2017-02-11 ENCOUNTER — Ambulatory Visit: Admitting: Physical Therapy

## 2017-02-11 DIAGNOSIS — R262 Difficulty in walking, not elsewhere classified: Secondary | ICD-10-CM

## 2017-02-11 DIAGNOSIS — M25562 Pain in left knee: Principal | ICD-10-CM

## 2017-02-11 DIAGNOSIS — M25552 Pain in left hip: Secondary | ICD-10-CM

## 2017-02-11 DIAGNOSIS — G8929 Other chronic pain: Secondary | ICD-10-CM

## 2017-02-11 NOTE — Therapy (Signed)
[place], [place], [address] Phone: [phone]   Fax:  [phone]  Physical Therapy Treatment  Patient Details  Name: [name] MRN: [id number] Date of Birth: March 25, 1976 Referring Provider: DO Laney Pastor   Encounter Date: 02/11/2017      PT End of Session - 02/11/17 1520    Visit Number 6   Number of Visits 16   Date for PT Re-Evaluation 03/16/17   PT Start Time 1508   PT Stop Time 1557   PT Time Calculation (min) 49 min   Activity Tolerance Patient tolerated treatment well   Behavior During Therapy [facility] for tasks assessed/performed      Past Medical History:  Diagnosis Date  . Asthma   . Ovarian cyst rupture 09/08/2012   left    Past Surgical History:  Procedure Laterality Date  . APPENDECTOMY    . OVARIAN CYST REMOVAL Right 09/08/2012   drained and removed    There were no vitals filed for this visit.      Subjective Assessment - 02/11/17 1513    Subjective "I am having a odd feeling that I have a spot in the L knee that feels like there is a rock in my knee"    Currently in Pain? Yes   Pain Score 7    Pain Location Knee   Pain Orientation Left   Aggravating Factors  standing/ walking, running   Pain Relieving Factors elevation and resting   Pain Score 4   Pain Orientation Left   Pain Descriptors / Indicators Aching   Pain Type Chronic pain   Pain Onset More than a month ago   Pain Frequency Intermittent   Aggravating Factors  standing/ walking   Pain Relieving Factors rest                         [facility] Adult PT Treatment/Exercise - 02/11/17 1518      Knee/Hip Exercises: Aerobic   Recumbent Bike L1 x 5 min      Knee/Hip Exercises: Supine   Short Arc Quad Sets Strengthening;Left;2 sets;15 reps  with ball squeeze   Bridges Limitations --   Straight Leg Raise with External Rotation 1 set;10 reps;Left     Modalities   Modalities Moist Heat      Moist Heat Therapy   Number Minutes Moist Heat 10 Minutes   Moist Heat Location Knee  supine     Manual Therapy   Manual therapy comments manual trigger point release over vastus lateralis   Muscle Energy Technique L hip flexors resisted hip flexion 3 x 10 sec hold   McConnell Medial taping of the patella. with apex medial tilt correction   reported decreased pain and tightness                  PT Short Term Goals - 02/04/17 1727      PT SHORT TERM GOAL #1   Title Patient will increase left gross LE strength to 4+/5    Period Weeks   Status On-going     PT SHORT TERM GOAL #2   Title Patient will be independent with basic HEP    Time 4   Period Weeks   Status On-going     PT SHORT TERM GOAL #3   Title Patient will be independent with taping    Time 4   Period Weeks   Status On-going  PT SHORT TERM GOAL #4   Title Patient will demsotrate full pain free PROM    Time 4   Period Weeks   Status On-going           PT Long Term Goals - 01/19/17 1656      PT LONG TERM GOAL #1   Title Patient will ambulate go up/down steps without buckling or pain in order to perfrom dialy activity    Time 8   Period Weeks   Status New     PT LONG TERM GOAL #2   Title Patient will ambualte 1 mile without self report of pain    Time 8   Period Weeks   Status New     PT LONG TERM GOAL #3   Title Patient will stand for 1 hour at work without increased pain    Time 8   Period Weeks   Status New               Plan - 02/11/17 1727    Clinical Impression Statement pt continues to demonstrated posterior rotation of the L innominate, performing hamstring stretch and MET techniques. Vastus lateralus manual techniques and VMO activation exercises and which she performed well and reported decreased pain. Trialed patellar taping with tilt correction which she reported improvement of pain.    PT Treatment/Interventions ADLs/Self Care Home  Management;Cryotherapy;Electrical Stimulation;Iontophoresis 60m/ml Dexamethasone;Gait training;Stair training;Therapeutic activities;Therapeutic exercise;Neuromuscular re-education;Patient/family education;Manual techniques;Splinting;Taping;Passive range of motion;Moist Heat;Ultrasound;Dry needling   PT Next Visit Plan core stability, L hip abduction strengthening, check pelvic alignment, how was patellar taping   PT Home Exercise Plan quad set; SLR; bridging; hamstring stretch; thomas stretch; hooklying pelvic tilt with ball squeeze, roll up/down   Consulted and Agree with Plan of Care Patient      Patient will benefit from skilled therapeutic intervention in order to improve the following deficits and impairments:  Pain, Decreased strength, Decreased knowledge of use of DME, Decreased activity tolerance, Difficulty walking, Decreased range of motion, Decreased safety awareness, Impaired UE functional use  Visit Diagnosis: Chronic pain of left knee  Difficulty in walking, not elsewhere classified  Pain in left hip     Problem List Patient Active Problem List   Diagnosis Date Noted  . Abnormal uterine bleeding (AUB) 07/11/2013  . Vaginitis and vulvovaginitis, unspecified 07/11/2013  . Postoperative wound infection 10/13/2012  . Other and unspecified ovarian cyst 09/28/2012  . Appendicitis 09/28/2012  . S/P appendectomy 09/13/2012  . S/P ovarian cystectomy 09/13/2012   KStarr LakePT, DPT, LAT, ATC  02/11/17  5:38 PM      [facility]ible[facility] Superior120 West StreetGHastings NAl[place] [address]Phone: [phone]  Fax:  [phone] Name: Shelby BARCELOMRN: [id number] of Birth: 9February 13, 1977

## 2017-02-16 ENCOUNTER — Encounter: Payer: Self-pay | Admitting: Physical Therapy

## 2017-02-16 ENCOUNTER — Ambulatory Visit: Admitting: Physical Therapy

## 2017-02-16 DIAGNOSIS — M25562 Pain in left knee: Principal | ICD-10-CM

## 2017-02-16 DIAGNOSIS — G8929 Other chronic pain: Secondary | ICD-10-CM

## 2017-02-16 DIAGNOSIS — R262 Difficulty in walking, not elsewhere classified: Secondary | ICD-10-CM

## 2017-02-16 DIAGNOSIS — M25552 Pain in left hip: Secondary | ICD-10-CM

## 2017-02-17 NOTE — Therapy (Signed)
[place], [place], [address] Phone: [phone]   Fax:  [phone]  Physical Therapy Treatment  Patient Details  Name: [name] MRN: [id number] Date of Birth: [birth year] Referring Provider: DO Laney Pastor   Encounter Date: 02/16/2017      PT End of Session - 02/16/17 1657    Visit Number 7   Number of Visits 16   Date for PT Re-Evaluation 03/16/17   PT Start Time 1645   PT Stop Time 1728   PT Time Calculation (min) 43 min   Activity Tolerance Patient tolerated treatment well   Behavior During Therapy [facility] for tasks assessed/performed      Past Medical History:  Diagnosis Date  . Asthma   . Ovarian cyst rupture 09/08/2012   left    Past Surgical History:  Procedure Laterality Date  . APPENDECTOMY    . OVARIAN CYST REMOVAL Right 09/08/2012   drained and removed    There were no vitals filed for this visit.      Subjective Assessment - 02/16/17 1658    Subjective Patient continues to report a 7/10 pain in her knee. She continues to have pain behind her knee cap. She has been doing her exercises at home without much improvement. She bought tape for home. The taping last visit made her more sore the next day.    Limitations Walking   How long can you stand comfortably? < 30 minutes    How long can you walk comfortably? pain increases with distance in the knee and hip    Currently in Pain? Yes   Pain Score 7    Pain Location Knee   Pain Orientation Left   Pain Descriptors / Indicators Aching   Pain Onset More than a month ago   Aggravating Factors  standing , walking, and running    Pain Score 4   Pain Location Hip   Pain Orientation Left   Pain Descriptors / Indicators Aching   Pain Type Chronic pain   Pain Onset More than a month ago   Pain Frequency Intermittent   Aggravating Factors  standing and walking    Pain Relieving Factors rest                           [facility] Adult PT Treatment/Exercise - 02/17/17 0001      Knee/Hip Exercises: Stretches   Active Hamstring Stretch Limitations 3x30 sec left    Quad Stretch Limitations thomas stretch 3x30 sec hold      Knee/Hip Exercises: Machines for Strengthening   Total Gym Leg Press 2x10 40 lbs cuing to avoid terminal extension     Knee/Hip Exercises: Supine   Quad Sets Limitations 2x10   Short Arc Quad Sets Strengthening;Left;2 sets;15 reps  with ball squeeze   Straight Leg Raises Limitations 3x10 2lb      Cryotherapy   Number Minutes Cryotherapy 10 Minutes   Cryotherapy Location Knee   Type of Cryotherapy Ice pack     Manual Therapy   Manual Therapy Muscle Energy Technique   Muscle Energy Technique L hip flexors, R extensors   McConnell reviewed self taping again                 PT Education - 02/16/17 1703    Education provided Yes   Education Details reviewed self MET    Person(s) Educated Patient   Methods Explanation;Demonstration;Tactile cues;Verbal cues;Handout  Comprehension Verbalized understanding;Returned demonstration;Verbal cues required;Tactile cues required;Need further instruction          PT Short Term Goals - 02/16/17 1714      PT SHORT TERM GOAL #1   Title Patient will increase left gross LE strength to 4+/5    Time 4   Period Weeks   Status On-going     PT SHORT TERM GOAL #2   Title Patient will be independent with basic HEP    Time 4   Period Weeks   Status On-going     PT SHORT TERM GOAL #3   Title Patient will be independent with taping    Baseline independently taped herself today    Time 4   Period Weeks   Status On-going     PT SHORT TERM GOAL #4   Title Patient will demsotrate full pain free PROM    Time 4   Period Weeks   Status On-going           PT Long Term Goals - 01/19/17 1656      PT LONG TERM GOAL #1   Title Patient will ambulate go up/down steps without buckling or pain in  order to perfrom dialy activity    Time 8   Period Weeks   Status New     PT LONG TERM GOAL #2   Title Patient will ambualte 1 mile without self report of pain    Time 8   Period Weeks   Status New     PT LONG TERM GOAL #3   Title Patient will stand for 1 hour at work without increased pain    Time 8   Period Weeks   Status New               Plan - 02/16/17 1703    Clinical Impression Statement Patients knee does not appear to be improving. Therpay worked on pelvic mobilization and strengthening today. she reported improved hip pain. she continues to have knee pain. The ptaping helps but does not last. Therapy will continue to focus on strengthening over the next few visits but unless the pain starts improving therapy will likely send her back to the MD for further evaluation.    Clinical Presentation Stable   Clinical Decision Making Moderate   Rehab Potential Good   PT Frequency 2x / week   PT Duration 8 weeks   PT Treatment/Interventions ADLs/Self Care Home Management;Cryotherapy;Electrical Stimulation;Iontophoresis 47m/ml Dexamethasone;Gait training;Stair training;Therapeutic activities;Therapeutic exercise;Neuromuscular re-education;Patient/family education;Manual techniques;Splinting;Taping;Passive range of motion;Moist Heat;Ultrasound;Dry needling   PT Next Visit Plan core stability, L hip abduction strengthening, check pelvic alignment, how was patellar taping   PT Home Exercise Plan quad set; SLR; bridging; hamstring stretch; thomas stretch; hooklying pelvic tilt with ball squeeze, roll up/down   Consulted and Agree with Plan of Care Patient      Patient will benefit from skilled therapeutic intervention in order to improve the following deficits and impairments:  Pain, Decreased strength, Decreased knowledge of use of DME, Decreased activity tolerance, Difficulty walking, Decreased range of motion, Decreased safety awareness, Impaired UE functional use  Visit  Diagnosis: Chronic pain of left knee  Difficulty in walking, not elsewhere classified  Pain in left hip     Problem List Patient Active Problem List   Diagnosis Date Noted  . Abnormal uterine bleeding (AUB) 07/11/2013  . Vaginitis and vulvovaginitis, unspecified 07/11/2013  . Postoperative wound infection 10/13/2012  . Other and unspecified ovarian cyst 09/28/2012  .  Appendicitis 09/28/2012  . S/P appendectomy 09/13/2012  . S/P ovarian cystectomy 09/13/2012    Carney Living PT DPT  02/17/2017, 1:04 PM  [facility] [address] [place], [place], [address] Phone: [phone]   Fax:  [phone]  Name: [name] MRN: [id number] Date of Birth: [birth year]

## 2017-02-18 ENCOUNTER — Ambulatory Visit: Admitting: Physical Therapy

## 2017-02-23 ENCOUNTER — Ambulatory Visit: Admitting: Physical Therapy

## 2017-02-23 DIAGNOSIS — M25552 Pain in left hip: Secondary | ICD-10-CM

## 2017-02-23 DIAGNOSIS — M25562 Pain in left knee: Secondary | ICD-10-CM | POA: Diagnosis not present

## 2017-02-23 DIAGNOSIS — G8929 Other chronic pain: Secondary | ICD-10-CM

## 2017-02-23 DIAGNOSIS — R262 Difficulty in walking, not elsewhere classified: Secondary | ICD-10-CM

## 2017-02-24 NOTE — Therapy (Signed)
[place], [place], [address] Phone: [phone]   Fax:  [phone]  Physical Therapy Treatment  Patient Details  Name: [name] MRN: [id number] Date of Birth: June 05, 1975 Referring Provider: DO Laney Pastor   Encounter Date: 02/23/2017      PT End of Session - 02/23/17 1639    Visit Number 8   Number of Visits 16   Date for PT Re-Evaluation 03/16/17   PT Start Time 1027   PT Stop Time 1718   PT Time Calculation (min) 43 min   Activity Tolerance Patient tolerated treatment well   Behavior During Therapy [facility] for tasks assessed/performed      Past Medical History:  Diagnosis Date  . Asthma   . Ovarian cyst rupture 09/08/2012   left    Past Surgical History:  Procedure Laterality Date  . APPENDECTOMY    . OVARIAN CYST REMOVAL Right 09/08/2012   drained and removed    There were no vitals filed for this visit.      Subjective Assessment - 02/23/17 1637    Subjective Patient reports the pain in her knee is about a 6/10. She does report it feels better though. She feels like she has the same funny feeling under her knee cap.    Limitations Walking   How long can you stand comfortably? < 30 minutes    How long can you walk comfortably? pain increases with distance in the knee and hip    Currently in Pain? Yes   Pain Score 6    Pain Location Knee   Pain Orientation Left   Pain Descriptors / Indicators Aching   Pain Type Chronic pain   Pain Onset More than a month ago   Pain Frequency Rarely   Aggravating Factors  standing, walking, and running    Pain Relieving Factors elevation and rest    Effect of Pain on Daily Activities difficulty perfroming daily tasks    Multiple Pain Sites No                         [facility] Adult PT Treatment/Exercise - 02/24/17 0001      Knee/Hip Exercises: Stretches   Active Hamstring Stretch Limitations 3x30 sec left    Quad  Stretch Limitations thomas stretch 3x30 sec hold      Knee/Hip Exercises: Aerobic   Recumbent Bike L1 x 5 min      Knee/Hip Exercises: Machines for Strengthening   Total Gym Leg Press 2x10 40 lbs cuing to avoid terminal extension     Knee/Hip Exercises: Standing   Lateral Step Up Limitations x10 4 inch    Forward Step Up Limitations 2x10 4 inch      Knee/Hip Exercises: Supine   Quad Sets Limitations 2x10    Short Arc Quad Sets Strengthening;Left;2 sets;15 reps  with ball squeeze   Bridges Limitations 2x10 with blue band    Straight Leg Raises Limitations 3x10 2lb    Other Supine Knee/Hip Exercises supine clam shell blue 2x10      Manual Therapy   Manual therapy comments Heat and patella creep stretch                 PT Education - 02/23/17 1639    Education provided Yes   Education Details reviewed Hip MET    Person(s) Educated Patient   Methods Explanation;Demonstration;Tactile cues;Verbal cues   Comprehension Verbalized understanding;Returned demonstration;Verbal cues required;Tactile  cues required;Need further instruction          PT Short Term Goals - 02/23/17 1650      PT SHORT TERM GOAL #1   Title Patient will increase left gross LE strength to 4+/5    Time 4   Period Weeks   Status On-going     PT SHORT TERM GOAL #2   Title Patient will be independent with basic HEP    Time 4   Period Weeks   Status Achieved     PT SHORT TERM GOAL #3   Title Patient will be independent with taping    Baseline independently taped herself today    Time 4   Period Weeks           PT Long Term Goals - 01/19/17 1656      PT LONG TERM GOAL #1   Title Patient will ambulate go up/down steps without buckling or pain in order to perfrom dialy activity    Time 8   Period Weeks   Status New     PT LONG TERM GOAL #2   Title Patient will ambualte 1 mile without self report of pain    Time 8   Period Weeks   Status New     PT LONG TERM GOAL #3   Title  Patient will stand for 1 hour at work without increased pain    Time 8   Period Weeks   Status New               Plan - 02/23/17 1641    Clinical Impression Statement Patient reports her knee has gotten better siunce the last visit. She has been working on her exercises at home. She feels like it is slowly improving. Her hip feels good. She would benefit from continued therapy to continue to work on strengthening.    Clinical Presentation Stable   Clinical Decision Making Low   Rehab Potential Good   PT Frequency 2x / week   PT Duration 8 weeks   PT Treatment/Interventions ADLs/Self Care Home Management;Cryotherapy;Electrical Stimulation;Iontophoresis 9m/ml Dexamethasone;Gait training;Stair training;Therapeutic activities;Therapeutic exercise;Neuromuscular re-education;Patient/family education;Manual techniques;Splinting;Taping;Passive range of motion;Moist Heat;Ultrasound;Dry needling   PT Next Visit Plan core stability, L hip abduction strengthening, check pelvic alignment, how was patellar taping   PT Home Exercise Plan quad set; SLR; bridging; hamstring stretch; thomas stretch; hooklying pelvic tilt with ball squeeze, roll up/down   Consulted and Agree with Plan of Care Patient      Patient will benefit from skilled therapeutic intervention in order to improve the following deficits and impairments:  Pain, Decreased strength, Decreased knowledge of use of DME, Decreased activity tolerance, Difficulty walking, Decreased range of motion, Decreased safety awareness, Impaired UE functional use  Visit Diagnosis: Chronic pain of left knee  Difficulty in walking, not elsewhere classified  Pain in left hip     Problem List Patient Active Problem List   Diagnosis Date Noted  . Abnormal uterine bleeding (AUB) 07/11/2013  . Vaginitis and vulvovaginitis, unspecified 07/11/2013  . Postoperative wound infection 10/13/2012  . Other and unspecified ovarian cyst 09/28/2012  .  Appendicitis 09/28/2012  . S/P appendectomy 09/13/2012  . S/P ovarian cystectomy 09/13/2012    DCarney LivingPT DPT  02/24/2017, 1:08 PM  [facility] [address]Phone: [phone]  Fax:  [phone] Name: Shelby HITTMRN: [id number] of Birth: 9November 19, 1977

## 2017-02-25 ENCOUNTER — Ambulatory Visit: Admitting: Physical Therapy

## 2017-03-03 ENCOUNTER — Encounter: Payer: Self-pay | Admitting: Rehabilitation

## 2017-03-03 ENCOUNTER — Ambulatory Visit: Admitting: Rehabilitation

## 2017-03-03 DIAGNOSIS — M25552 Pain in left hip: Secondary | ICD-10-CM

## 2017-03-03 DIAGNOSIS — R262 Difficulty in walking, not elsewhere classified: Secondary | ICD-10-CM

## 2017-03-03 DIAGNOSIS — G8929 Other chronic pain: Secondary | ICD-10-CM

## 2017-03-03 DIAGNOSIS — M25562 Pain in left knee: Principal | ICD-10-CM

## 2017-03-03 NOTE — Therapy (Signed)
[place], [place], [address] Phone: [phone]   Fax:  [phone]  Physical Therapy Treatment  Patient Details  Name: [name] MRN: [id number] Date of Birth: Sep 14, 1975 Referring Provider: DO Laney Pastor   Encounter Date: 03/03/2017      PT End of Session - 03/03/17 1720    Visit Number 9   Number of Visits 16   Date for PT Re-Evaluation 03/16/17   PT Start Time 1632   PT Stop Time 1719   PT Time Calculation (min) 47 min   Activity Tolerance Patient tolerated treatment well   Behavior During Therapy [facility] for tasks assessed/performed      Past Medical History:  Diagnosis Date  . Asthma   . Ovarian cyst rupture 09/08/2012   left    Past Surgical History:  Procedure Laterality Date  . APPENDECTOMY    . OVARIAN CYST REMOVAL Right 09/08/2012   drained and removed    There were no vitals filed for this visit.      Subjective Assessment - 03/03/17 1637    Subjective It feels the same.  The knee buckled when going down the stairs the other day   Currently in Pain? Yes   Pain Score 7    Pain Location Knee   Pain Orientation Left   Pain Descriptors / Indicators Aching   Pain Type Chronic pain   Pain Onset More than a month ago   Aggravating Factors  standing, walking, and running   Pain Relieving Factors elevation and rest                          [facility] Adult PT Treatment/Exercise - 03/03/17 0001      Knee/Hip Exercises: Aerobic   Recumbent Bike L2x5     Knee/Hip Exercises: Machines for Strengthening   Total Gym Leg Press 2x10 40 lbs cuing to avoid terminal extension     Knee/Hip Exercises: Standing   Hip Abduction Left;1 set;15 reps   Abduction Limitations blue   Lateral Step Up Limitations x10 4 inch    Forward Step Up Limitations up and over 4"      Knee/Hip Exercises: Supine   Quad Sets Limitations 5"x10 Rt   Short Arc Quad Sets  Strengthening;Left;2 sets;15 reps   Bridges with Clamshell 2 sets;10 reps  blue   Straight Leg Raises Limitations x10 3lb   Other Supine Knee/Hip Exercises hooklying ball squeeze LAQ x 10   Other Supine Knee/Hip Exercises supine clam shell 3x10 blue                  PT Short Term Goals - 02/23/17 1650      PT SHORT TERM GOAL #1   Title Patient will increase left gross LE strength to 4+/5    Time 4   Period Weeks   Status On-going     PT SHORT TERM GOAL #2   Title Patient will be independent with basic HEP    Time 4   Period Weeks   Status Achieved     PT SHORT TERM GOAL #3   Title Patient will be independent with taping    Baseline independently taped herself today    Time 4   Period Weeks           PT Long Term Goals - 03/03/17 1721      PT LONG TERM GOAL #1   Title Patient  will ambulate go up/down steps without buckling or pain in order to perfrom dialy activity    Status On-going     PT LONG TERM GOAL #2   Title Patient will ambualte 1 mile without self report of pain    Status On-going     PT LONG TERM GOAL #3   Title Patient will stand for 1 hour at work without increased pain    Status On-going               Plan - 03/03/17 1720    Clinical Impression Statement Pt continues with high levels of knee pain and pain with activity.  increased SLR weight and added standing hip abduction without increased pain.     PT Treatment/Interventions ADLs/Self Care Home Management;Cryotherapy;Electrical Stimulation;Iontophoresis 4mg /ml Dexamethasone;Gait training;Stair training;Therapeutic activities;Therapeutic exercise;Neuromuscular re-education;Patient/family education;Manual techniques;Splinting;Taping;Passive range of motion;Moist Heat;Ultrasound;Dry needling   PT Next Visit Plan core stability, L hip abduction strengthening, check pelvic alignment, how was patellar taping   PT Home Exercise Plan quad set; SLR; bridging; hamstring stretch; thomas  stretch; hooklying pelvic tilt with ball squeeze, roll up/down      Patient will benefit from skilled therapeutic intervention in order to improve the following deficits and impairments:  Pain, Decreased strength, Decreased knowledge of use of DME, Decreased activity tolerance, Difficulty walking, Decreased range of motion, Decreased safety awareness, Impaired UE functional use  Visit Diagnosis: Chronic pain of left knee  Difficulty in walking, not elsewhere classified  Pain in left hip     Problem List Patient Active Problem List   Diagnosis Date Noted  . Abnormal uterine bleeding (AUB) 07/11/2013  . Vaginitis and vulvovaginitis, unspecified 07/11/2013  . Postoperative wound infection 10/13/2012  . Other and unspecified ovarian cyst 09/28/2012  . Appendicitis 09/28/2012  . S/P appendectomy 09/13/2012  . S/P ovarian cystectomy 09/13/2012    Stark Bray, DPT, CMP 03/03/2017, 5:22 PM  [facility] [address] [place], [place], [address] Phone: [phone]   Fax:  [phone]  Name: [name] MRN: [id number] Date of Birth: 1975/07/05

## 2017-03-04 ENCOUNTER — Ambulatory Visit: Admitting: Physical Therapy

## 2017-03-04 DIAGNOSIS — R262 Difficulty in walking, not elsewhere classified: Secondary | ICD-10-CM

## 2017-03-04 DIAGNOSIS — M25552 Pain in left hip: Secondary | ICD-10-CM

## 2017-03-04 DIAGNOSIS — G8929 Other chronic pain: Secondary | ICD-10-CM

## 2017-03-04 DIAGNOSIS — M25562 Pain in left knee: Principal | ICD-10-CM

## 2017-03-04 NOTE — Therapy (Signed)
[place], [place], [address] Phone: [phone]   Fax:  [phone]  Physical Therapy Treatment  Patient Details  Name: DRAKE LANDING MRN: [id number] Date of Birth: November 03, 1975 Referring Provider: DO Laney Pastor   Encounter Date: 03/04/2017      PT End of Session - 03/04/17 1644    Visit Number 10   Number of Visits 16   Date for PT Re-Evaluation 03/16/17   PT Start Time 5809  pt arrived 14 minutes late   PT Stop Time 1718   PT Time Calculation (min) 34 min   Activity Tolerance Patient tolerated treatment well   Behavior During Therapy [facility] for tasks assessed/performed      Past Medical History:  Diagnosis Date  . Asthma   . Ovarian cyst rupture 09/08/2012   left    Past Surgical History:  Procedure Laterality Date  . APPENDECTOMY    . OVARIAN CYST REMOVAL Right 09/08/2012   drained and removed    There were no vitals filed for this visit.      Subjective Assessment - 03/04/17 1644    Subjective "things have been going alright, I am making progress"    Currently in Pain? Yes   Pain Score 7    Pain Location Knee   Pain Orientation Left   Pain Descriptors / Indicators Aching   Pain Type Chronic pain                         [facility] Adult PT Treatment/Exercise - 03/04/17 1701      Knee/Hip Exercises: Stretches   Active Hamstring Stretch Limitations 3x30 sec left   contract/ relax with 10 sec contraction     Knee/Hip Exercises: Supine   Short Arc Quad Sets 2 sets;15 reps  with ball squeeze 4#     Manual Therapy   Manual Therapy Soft tissue mobilization   Soft tissue mobilization IASTM over vastus lateralis   Muscle Energy Technique R sidelying with resisted L hip flexion combined with anterior innominate grade 3 mob          Trigger Point Dry Needling - 03/04/17 1700    Consent Given? Yes   Education Handout Provided Yes   Muscles Treated Lower  Body Quadriceps   Quadriceps Response Twitch response elicited;Palpable increased muscle length  L quadricep              PT Education - 03/04/17 1728    Education provided Yes   Education Details muscle anatomy and referral patterns. What Dn is, what to expect and after care, updated HEP for self hip flexor MET.   Person(s) Educated Patient   Methods Explanation;Verbal cues   Comprehension Verbalized understanding;Verbal cues required          PT Short Term Goals - 02/23/17 1650      PT SHORT TERM GOAL #1   Title Patient will increase left gross LE strength to 4+/5    Time 4   Period Weeks   Status On-going     PT SHORT TERM GOAL #2   Title Patient will be independent with basic HEP    Time 4   Period Weeks   Status Achieved     PT SHORT TERM GOAL #3   Title Patient will be independent with taping    Baseline independently taped herself today    Time 4   Period Weeks  PT Long Term Goals - 03/03/17 1721      PT LONG TERM GOAL #1   Title Patient will ambulate go up/down steps without buckling or pain in order to perfrom dialy activity    Status On-going     PT LONG TERM GOAL #2   Title Patient will ambualte 1 mile without self report of pain    Status On-going     PT LONG TERM GOAL #3   Title Patient will stand for 1 hour at work without increased pain    Status On-going               Plan - 03/04/17 1729    Clinical Impression Statement pt reported conintued knee pain rated at 7/10 today. Educated and performed Dn over vastus lateralis followed with soft tissue techniques. pt reported low back pain in the L PSIS and exhibits possible posterior rotation which may be effecting the quads / knees. Following MET techniques she reported decreased pain to 5/10. Opted to hold off on patellar taping to assess progress of treatment. She declined modalities post session and pt reported 5/10 pain.    PT Next Visit Plan look at posterior rotation  of L innominate, how was DN, core stability, L hip abduction strengthening, check pelvic alignment, how was patellar taping   PT Home Exercise Plan quad set; SLR; bridging; hamstring stretch; thomas stretch; hooklying pelvic tilt with ball squeeze, roll up/down   Consulted and Agree with Plan of Care Patient      Patient will benefit from skilled therapeutic intervention in order to improve the following deficits and impairments:  Pain, Decreased strength, Decreased knowledge of use of DME, Decreased activity tolerance, Difficulty walking, Decreased range of motion, Decreased safety awareness, Impaired UE functional use  Visit Diagnosis: Chronic pain of left knee  Difficulty in walking, not elsewhere classified  Pain in left hip     Problem List Patient Active Problem List   Diagnosis Date Noted  . Abnormal uterine bleeding (AUB) 07/11/2013  . Vaginitis and vulvovaginitis, unspecified 07/11/2013  . Postoperative wound infection 10/13/2012  . Other and unspecified ovarian cyst 09/28/2012  . Appendicitis 09/28/2012  . S/P appendectomy 09/13/2012  . S/P ovarian cystectomy 09/13/2012   Starr Lake PT, DPT, LAT, ATC  03/04/17  5:34 PM      [facility] [address] [place], [place], [address] Phone: [phone]   Fax:  [phone]  Name: YARETZI ERNANDEZ MRN: [id number] Date of Birth: [birth year]

## 2017-03-08 ENCOUNTER — Ambulatory Visit: Admitting: Physical Therapy

## 2017-03-10 ENCOUNTER — Encounter: Payer: Self-pay | Admitting: Physical Therapy

## 2017-03-10 ENCOUNTER — Ambulatory Visit: Admitting: Physical Therapy

## 2017-03-10 DIAGNOSIS — R262 Difficulty in walking, not elsewhere classified: Secondary | ICD-10-CM

## 2017-03-10 DIAGNOSIS — M25562 Pain in left knee: Principal | ICD-10-CM

## 2017-03-10 DIAGNOSIS — M25552 Pain in left hip: Secondary | ICD-10-CM

## 2017-03-10 DIAGNOSIS — G8929 Other chronic pain: Secondary | ICD-10-CM

## 2017-03-11 ENCOUNTER — Ambulatory Visit: Payer: Self-pay | Admitting: Family Medicine

## 2017-03-11 NOTE — Therapy (Addendum)
[facility] [place], [place], [address] Phone: [phone]   Fax:  [phone]  Physical Therapy Treatment / Discharge Summary  Patient Details  Name: [name] MRN: [id number] Date of Birth: 09-Apr-1976 Referring Provider: DO Laney Pastor   Encounter Date: 03/10/2017      PT End of Session - 03/10/17 1704    Visit Number 11   Number of Visits 16   Date for PT Re-Evaluation 03/16/17   PT Start Time 1630   PT Stop Time 1717   PT Time Calculation (min) 47 min   Activity Tolerance Patient tolerated treatment well   Behavior During Therapy [facility] for tasks assessed/performed      Past Medical History:  Diagnosis Date  . Asthma   . Ovarian cyst rupture 09/08/2012   left    Past Surgical History:  Procedure Laterality Date  . APPENDECTOMY    . OVARIAN CYST REMOVAL Right 09/08/2012   drained and removed    There were no vitals filed for this visit.      Subjective Assessment - 03/10/17 1701    Subjective Patient reports the needling helped. She has had less pain since then but she is still in pain. Overall there is no real change in her pain.    Limitations Walking   How long can you stand comfortably? < 30 minutes    How long can you walk comfortably? pain increases with distance in the knee and hip    Currently in Pain? Yes   Pain Score 6    Pain Location Knee   Pain Orientation Left   Pain Descriptors / Indicators Aching   Pain Type Chronic pain   Pain Onset Today   Pain Frequency Rarely   Aggravating Factors  standing, walking and running    Pain Relieving Factors Elevation and rest    Effect of Pain on Daily Activities difficulty perfroming daily tasks    Multiple Pain Sites No                         [facility] Adult PT Treatment/Exercise - 03/11/17 0001      Knee/Hip Exercises: Stretches   Active Hamstring Stretch Limitations 3x30 sec left   contract/ relax with 10  sec contraction   Quad Stretch Limitations thomas stretch 3x30 sec hold      Knee/Hip Exercises: Aerobic   Recumbent Bike L2x5     Knee/Hip Exercises: Machines for Strengthening   Total Gym Leg Press 2x10 40 lbs cuing to avoid terminal extension     Knee/Hip Exercises: Supine   Short Arc Quad Sets 2 sets;15 reps  with ball squeeze 4#   Bridges with Clamshell 2 sets;10 reps  blue   Straight Leg Raises Limitations x10 3lb   Other Supine Knee/Hip Exercises hooklying ball squeeze LAQ x 10   Other Supine Knee/Hip Exercises supine clam shell 3x10 blue     Manual Therapy   Manual Therapy Soft tissue mobilization   Soft tissue mobilization IASTM over vastus lateralis   Muscle Energy Technique R sidelying with resisted L hip flexion combined with anterior innominate grade 3 mob          Trigger Point Dry Needling - 03/11/17 0958    Consent Given? Yes   Education Handout Provided Yes   Quadriceps Response Twitch response elicited  Left               PT Education -  03/10/17 1704    Education provided Yes   Education Details reviewed exercises to continue; reducing post needlesoreness.    Person(s) Educated Patient   Methods Explanation;Demonstration   Comprehension Verbalized understanding;Returned demonstration;Verbal cues required          PT Short Term Goals - 02/23/17 1650      PT SHORT TERM GOAL #1   Title Patient will increase left gross LE strength to 4+/5    Time 4   Period Weeks   Status On-going     PT SHORT TERM GOAL #2   Title Patient will be independent with basic HEP    Time 4   Period Weeks   Status Achieved     PT SHORT TERM GOAL #3   Title Patient will be independent with taping    Baseline independently taped herself today    Time 4   Period Weeks           PT Long Term Goals - 03/03/17 1721      PT LONG TERM GOAL #1   Title Patient will ambulate go up/down steps without buckling or pain in order to perfrom dialy activity     Status On-going     PT LONG TERM GOAL #2   Title Patient will ambualte 1 mile without self report of pain    Status On-going     PT LONG TERM GOAL #3   Title Patient will stand for 1 hour at work without increased pain    Status On-going               Plan - 03/10/17 1709    Clinical Impression Statement Patient continues to have good days and then days they are worse then when she started. Needling has helped but there has been very little carryover. At this point she would benefit from a return to the MD for further follow up. She continues to have pain behind her knee cap. Therapy has tried tapping, ultrasound and quad strengthening without benefit.    Clinical Presentation Stable   Clinical Decision Making Low   Rehab Potential Good   PT Frequency 2x / week   PT Duration 8 weeks   PT Treatment/Interventions ADLs/Self Care Home Management;Cryotherapy;Electrical Stimulation;Iontophoresis '4mg'$ /ml Dexamethasone;Gait training;Stair training;Therapeutic activities;Therapeutic exercise;Neuromuscular re-education;Patient/family education;Manual techniques;Splinting;Taping;Passive range of motion;Moist Heat;Ultrasound;Dry needling   PT Next Visit Plan look at posterior rotation of L innominate, how was DN, core stability, L hip abduction strengthening, check pelvic alignment, how was patellar taping   PT Home Exercise Plan quad set; SLR; bridging; hamstring stretch; thomas stretch; hooklying pelvic tilt with ball squeeze, roll up/down      Patient will benefit from skilled therapeutic intervention in order to improve the following deficits and impairments:  Pain, Decreased strength, Decreased knowledge of use of DME, Decreased activity tolerance, Difficulty walking, Decreased range of motion, Decreased safety awareness, Impaired UE functional use  Visit Diagnosis: Chronic pain of left knee  Difficulty in walking, not elsewhere classified  Pain in left hip     Problem  List Patient Active Problem List   Diagnosis Date Noted  . Abnormal uterine bleeding (AUB) 07/11/2013  . Vaginitis and vulvovaginitis, unspecified 07/11/2013  . Postoperative wound infection 10/13/2012  . Other and unspecified ovarian cyst 09/28/2012  . Appendicitis 09/28/2012  . S/P appendectomy 09/13/2012  . S/P ovarian cystectomy 09/13/2012    Carney Living PT DPT  03/11/2017, 9:58 AM  [facility]  [place], [place], [address] Phone: [phone]   Fax:  [phone]  Name: [name] MRN: [id number] Date of Birth: 1975/12/09     PHYSICAL THERAPY DISCHARGE SUMMARY  Visits from Start of Care: 11  Current functional level related to goals / functional outcomes: See goals   Remaining deficits: Unknown due to not returning   Education / Equipment: HEP, theraband  Plan: Patient agrees to discharge.  Patient goals were partially met. Patient is being discharged due to not returning since the last visit.  ?????       Kristoffer Leamon PT, DPT, LAT, ATC  04/20/17  3:44 PM

## 2017-03-15 ENCOUNTER — Ambulatory Visit (INDEPENDENT_AMBULATORY_CARE_PROVIDER_SITE_OTHER): Admitting: Family Medicine

## 2017-03-15 ENCOUNTER — Encounter: Payer: Self-pay | Admitting: Family Medicine

## 2017-03-15 VITALS — BP 106/62 | HR 80 | Temp 98.4°F | Ht 64.0 in | Wt 187.0 lb

## 2017-03-15 DIAGNOSIS — M25562 Pain in left knee: Secondary | ICD-10-CM

## 2017-03-15 DIAGNOSIS — G8929 Other chronic pain: Secondary | ICD-10-CM

## 2017-03-15 DIAGNOSIS — M25552 Pain in left hip: Secondary | ICD-10-CM

## 2017-03-15 MED ORDER — METHYLPREDNISOLONE 4 MG PO TBPK: ORAL_TABLET | ORAL | 0 refills | Status: DC

## 2017-03-15 MED FILL — METHYLPREDNISOLONE 4 MG TAB: 4 | 6 days supply | Qty: 21 | Fill #0

## 2017-03-15 NOTE — Patient Instructions (Addendum)
If you do not hear anything about your referral in the next 1-2 weeks, call our office and ask for an update.  Ice/cold pack over area for 10-15 min every 2-3 hours while awake.  Heat (pad or rice pillow in microwave) over affected area, 10-15 minutes every 2-3 hours while awake.   Let us know if you need anything.

## 2017-03-15 NOTE — Progress Notes (Signed)
Chief Complaint  Patient presents with  . Hip Pain    left  . Knee Pain    left    Subjective: Patient is a 41 y.o. female here for follow-up left hip and knee pain. She has been through a round of physical therapy that has not been helpful.  She reports compliance with her home exercise program.  Her anterior and outer left hip are painful as well as the front of her left knee.  This is a long-standing issue.  It is causing her to have pain when running and doing sit ups.  She is unable to enroll in an [employer] class because of it.  She is wondering what the next step is.   ROS: MSK: As noted in HPI  Family History  Problem Relation Age of Onset  . Cancer Neg Hx    Past Medical History:  Diagnosis Date  . Asthma   . Ovarian cyst rupture 09/08/2012   left   Allergies  Allergen Reactions  . Acetaminophen Hives    Current Outpatient Medications:  .  meloxicam (MOBIC) 15 MG tablet, Take 1 tablet (15 mg total) by mouth daily., Disp: 30 tablet, Rfl: 0 .  Norethindrone Acetate-Ethinyl Estradiol (JUNEL 1.5/30) 1.5-30 MG-MCG tablet, Take 1 tablet by mouth daily., Disp: 21 tablet, Rfl: 12 .  methylPREDNISolone (MEDROL DOSEPAK) 4 MG TBPK tablet, Follow instructions on package., Disp: 21 tablet, Rfl: 0  Objective: BP 106/62 (BP Location: Left Arm, Patient Position: Sitting, Cuff Size: Normal)   Pulse 80   Temp 98.4 F (36.9 C) (Oral)   Ht 5\' 4"  (1.626 m)   Wt 187 lb (84.8 kg)   SpO2 98%   BMI 32.10 kg/m  General: Awake, appears stated age Lungs: No accessory muscle use MSK: L knee Normal ROM, some lateral jt line TTP, no deformity or crepitus; positive patellar apprehension and grind; negative McMurray's, Lachman's, varus/valgus Left hip Nml ROM, TTP over great troch bursa, neg Stinchfield, Ober's, FADDIR, FABER Psych: Age appropriate judgment and insight, normal affect and mood  Assessment and Plan: Left hip pain - Plan: Ambulatory referral to Sports Medicine  Chronic pain of  left knee  Refer to sports med for possible US imaging and discussing of next steps. Offered MRI vs injections also. Steroid for acute pian relief.  F/u prn otherwise.  The patient voiced understanding and agreement to the plan.  Kemper, DO 03/15/17  5:09 PM

## 2017-03-15 NOTE — Progress Notes (Signed)
Pre visit review using our clinic review tool, if applicable. No additional management support is needed unless otherwise documented below in the visit note. 

## 2017-03-17 ENCOUNTER — Encounter: Admitting: Physical Therapy

## 2017-03-25 ENCOUNTER — Encounter: Payer: Self-pay | Admitting: Family Medicine

## 2017-03-25 ENCOUNTER — Ambulatory Visit (INDEPENDENT_AMBULATORY_CARE_PROVIDER_SITE_OTHER): Admitting: Family Medicine

## 2017-03-25 DIAGNOSIS — M25562 Pain in left knee: Secondary | ICD-10-CM | POA: Diagnosis not present

## 2017-03-25 DIAGNOSIS — G8929 Other chronic pain: Secondary | ICD-10-CM | POA: Diagnosis not present

## 2017-03-25 DIAGNOSIS — M25552 Pain in left hip: Secondary | ICD-10-CM

## 2017-03-25 NOTE — Patient Instructions (Signed)
You have patellofemoral syndrome/fat pad impingement and IT band syndrome. Avoid painful activities when possible (often deep squats, lunges bother this). Cross train with swimming, cycling with low resistance, elliptical if needed. Straight leg raise, hip side raises, straight leg raises with foot turned outwards 3 sets of 10 once a day.  Consider the bonus exercise with a ball, knee extensions as well. Add ankle weight if these become too easy. Hold stretches for 20-30 seconds (pick 2-3 on the handout) and repeat 3 times Correct foot breakdown with something like superfeet, spencos, or our green sports insoles with scaphoid pads. Avoid flat shoes, barefoot walking as much as possible. Icing 15 minutes at a time 3-4 times a day as needed. Tylenol or ibuprofen as needed for pain. Follow up with me in 6 weeks. I will fill out the [employer] form for you.

## 2017-03-29 ENCOUNTER — Encounter: Payer: Self-pay | Admitting: Family Medicine

## 2017-03-29 DIAGNOSIS — M25552 Pain in left hip: Secondary | ICD-10-CM | POA: Insufficient documentation

## 2017-03-29 DIAGNOSIS — M25562 Pain in left knee: Secondary | ICD-10-CM | POA: Insufficient documentation

## 2017-03-29 NOTE — Assessment & Plan Note (Signed)
consistent with patellofemoral syndrome with 3 risk factors.  MRI consistent with fat pad impingement, friction syndrome.  Shown home exercises to do daily, stressed importance of arch supports.  Icing, tylenol or ibuprofen.  Cross training if needed.  F/u in 6 weeks.

## 2017-03-29 NOTE — Progress Notes (Signed)
PCP and consultation requested by: Shelda Pal, DO  Subjective:   HPI: Patient is a 41 y.o. female here for left hip and knee pain.  Patient reports for about 3 months she's had lateral left hip pain to 6/10 level, sharp. Radiates from buttocks to groin. Worse with sitting a long time, standing a long time. Recalls pop anterolaterally when training. Also with left knee pain that is anterior. Gets some swelling. Felt unstable back in [place] and August.   Pain level 7/10 and sharp. Tried heat/ice, mobic without benefit. Tried prednisone also. Finished physical therapy recently without much benefit. No skin changes, numbness.  Past Medical History:  Diagnosis Date  . Asthma   . Ovarian cyst rupture 09/08/2012   left    Current Outpatient Medications on File Prior to Visit  Medication Sig Dispense Refill  . meloxicam (MOBIC) 15 MG tablet Take 1 tablet (15 mg total) by mouth daily. 30 tablet 0  . methylPREDNISolone (MEDROL DOSEPAK) 4 MG TBPK tablet Follow instructions on package. 21 tablet 0  . Norethindrone Acetate-Ethinyl Estradiol (JUNEL 1.5/30) 1.5-30 MG-MCG tablet Take 1 tablet by mouth daily. 21 tablet 12   No current facility-administered medications on file prior to visit.     Past Surgical History:  Procedure Laterality Date  . APPENDECTOMY    . OVARIAN CYST REMOVAL Right 09/08/2012   drained and removed    Allergies  Allergen Reactions  . Acetaminophen Hives  . Bee Venom Hives, Itching and Swelling    Social History   Socioeconomic History  . Marital status: Married    Spouse name: Jacques Willingham.   . Number of children: 2  . Years of education: Not on file  . Highest education level: Not on file  Social Needs  . Financial resource strain: Not on file  . Food insecurity - worry: Not on file  . Food insecurity - inability: Not on file  . Transportation needs - medical: Not on file  . Transportation needs - non-medical: Not on file   Occupational History  . Occupation: Theatre stage manager: [employer]  Tobacco Use  . Smoking status: Never Smoker  . Smokeless tobacco: Never Used  Substance and Sexual Activity  . Alcohol use: No    Alcohol/week: 0.0 oz  . Drug use: No  . Sexual activity: Yes    Partners: Male    Birth control/protection: Pill  Other Topics Concern  . Not on file  Social History Narrative  . Not on file    Family History  Problem Relation Age of Onset  . Cancer Neg Hx     BP 138/80   Pulse 85   Ht 5\' 4"  (1.626 m)   Wt 180 lb (81.6 kg)   BMI 30.90 kg/m   Review of Systems: See HPI above.     Objective:  Physical Exam:  Gen: NAD, comfortable in exam room  Left hip: No gross deformity. TTP laterally over proximal IT band.  No other tenderness. FROM with 5/5 strength except 4/5 with hip abduction. Negative logroll. Negative piriformis, fabers stretches.  Left knee: Pes planus. No gross deformity, ecchymoses, effusion.  VMO atrophy. Mild TTP post patellar facets. FROM with 5/5 strength. Negative ant/post drawers. Negative valgus/varus testing. Negative lachmanns. Negative mcmurrays, apleys, patellar apprehension. NV intact distally.   Right knee: No gross deformity. No TTP. FROM with full strength.  Assessment & Plan:  1. Left hip pain - 2/2 IT band syndrome.  Shown  home exercises and stretches to do daily.  Arch supports.  Tylenol or aleve if needed.  F/u in 6 weeks.  2. Left knee pain - consistent with patellofemoral syndrome with 3 risk factors.  MRI consistent with fat pad impingement, friction syndrome.  Shown home exercises to do daily, stressed importance of arch supports.  Icing, tylenol or ibuprofen.  Cross training if needed.  F/u in 6 weeks.

## 2017-03-29 NOTE — Assessment & Plan Note (Signed)
2/2 IT band syndrome.  Shown home exercises and stretches to do daily.  Arch supports.  Tylenol or aleve if needed.  F/u in 6 weeks.

## 2017-05-12 ENCOUNTER — Encounter: Payer: Self-pay | Admitting: Family Medicine

## 2017-05-12 ENCOUNTER — Ambulatory Visit (INDEPENDENT_AMBULATORY_CARE_PROVIDER_SITE_OTHER): Admitting: Family Medicine

## 2017-05-12 ENCOUNTER — Telehealth: Payer: Self-pay | Admitting: Family Medicine

## 2017-05-12 DIAGNOSIS — M545 Low back pain, unspecified: Secondary | ICD-10-CM | POA: Insufficient documentation

## 2017-05-12 DIAGNOSIS — M79605 Pain in left leg: Secondary | ICD-10-CM

## 2017-05-12 MED ORDER — PREDNISONE 10 MG PO TABS: ORAL_TABLET | ORAL | 0 refills | Status: DC

## 2017-05-12 MED FILL — predniSONE 10 MG TABS: 10 | 6 days supply | Qty: 21 | Fill #0

## 2017-05-12 NOTE — Assessment & Plan Note (Signed)
not improving with home exercise program, advil.  Now with numbness as well and radiation further down the leg.  Concerning for radiculopathy.  She will start prednisone dose pack, reviewed home exercise program for the back.  Consider physical therapy if not improving over next 1-2 weeks.  F/u in 1 month to 6 weeks.  Consider imaging as well if not improving.

## 2017-05-12 NOTE — Telephone Encounter (Signed)
Patient needs a letter for the [employer] stating exercise limitations. Requesting sit-ups to be included in letter.   Patient will pick up letter when ready.

## 2017-05-12 NOTE — Telephone Encounter (Signed)
Left message informing patient that letter is ready for pickup

## 2017-05-12 NOTE — Patient Instructions (Addendum)
You have lumbar radiculopathy (a pinched nerve in your low back) and mild sacroiliac dysfunction. A prednisone dose pack is the best option for immediate relief and may be prescribed. Don't take advil while taking the prednisone but you can restart this the day after you finish the prednisone. Stay as active as possible. Physical therapy has been shown to be helpful as well - consider this if the home exercises/stretches aren't helping enough. Strengthening of low back muscles, abdominal musculature are key for long term pain relief. If not improving, will consider further imaging (MRI). Follow up with me in 1 month to 6 weeks.

## 2017-05-12 NOTE — Progress Notes (Signed)
PCP and consultation requested by: Shelda Pal, DO  Subjective:   HPI: Patient is a 42 y.o. female here for left hip and knee pain.  03/25/17: Patient reports for about 3 months she's had lateral left hip pain to 6/10 level, sharp. Radiates from buttocks to groin. Worse with sitting a long time, standing a long time. Recalls pop anterolaterally when training. Also with left knee pain that is anterior. Gets some swelling. Felt unstable back in [place] and August.   Pain level 7/10 and sharp. Tried heat/ice, mobic without benefit. Tried prednisone also. Finished physical therapy recently without much benefit. No skin changes, numbness.  05/12/17: Patient reports she feels about the same if not worse. Pain level is 6-7/10 and sharp. Radiates from low back now into the groin and down leg. Associated numbness now into toes on the left. Takes advil sometimes. Is doing home exercises and stretches but not noticed any difference. Worse with prolonged sitting or standing. No skin changes. No bowel/bladder dysfunction. Past Medical History:  Diagnosis Date  . Asthma   . Ovarian cyst rupture 09/08/2012   left    Current Outpatient Medications on File Prior to Visit  Medication Sig Dispense Refill  . meloxicam (MOBIC) 15 MG tablet Take 1 tablet (15 mg total) by mouth daily. 30 tablet 0  . methylPREDNISolone (MEDROL DOSEPAK) 4 MG TBPK tablet Follow instructions on package. 21 tablet 0  . Norethindrone Acetate-Ethinyl Estradiol (JUNEL 1.5/30) 1.5-30 MG-MCG tablet Take 1 tablet by mouth daily. 21 tablet 12   No current facility-administered medications on file prior to visit.     Past Surgical History:  Procedure Laterality Date  . APPENDECTOMY    . OVARIAN CYST REMOVAL Right 09/08/2012   drained and removed    Allergies  Allergen Reactions  . Acetaminophen Hives  . Bee Venom Hives, Itching and Swelling    Social History   Socioeconomic History  . Marital status:  Married    Spouse name: Lagina Reader.   . Number of children: 2  . Years of education: Not on file  . Highest education level: Not on file  Social Needs  . Financial resource strain: Not on file  . Food insecurity - worry: Not on file  . Food insecurity - inability: Not on file  . Transportation needs - medical: Not on file  . Transportation needs - non-medical: Not on file  Occupational History  . Occupation: Theatre stage manager: [employer]  Tobacco Use  . Smoking status: Never Smoker  . Smokeless tobacco: Never Used  Substance and Sexual Activity  . Alcohol use: No    Alcohol/week: 0.0 oz  . Drug use: No  . Sexual activity: Yes    Partners: Male    Birth control/protection: Pill  Other Topics Concern  . Not on file  Social History Narrative  . Not on file    Family History  Problem Relation Age of Onset  . Cancer Neg Hx     BP 117/83   Pulse 95   Ht 5\' 4"  (1.626 m)   Wt 175 lb (79.4 kg)   BMI 30.04 kg/m   Review of Systems: See HPI above.     Objective:  Physical Exam:  Gen: NAD, comfortable in exam room.  Back: No gross deformity, scoliosis. TTP lumbar paraspinal regions left > right.   FROM with pain on extension and right lateral rotation. Strength LEs 5/5 all muscle groups.   2+ MSRs in  patellar and achilles tendons, equal bilaterally. Negative SLRs. Sensation intact to light touch bilaterally.  Left hip: No deformity. TTP proximal IT band, hip external rotators. FROM with 5/5 strength all directions. Negative logroll bilateral hips Negative piriformis stretches.  Mild pain left fabers. NVI distally.  Left knee: No gross deformity, ecchymoses, swelling. No TTP. FROM with 5/5 strength. Negative ant/post drawers. Negative valgus/varus testing. Negative lachmanns. Negative mcmurrays, apleys, patellar apprehension. NV intact distally.   Assessment & Plan:  1. Low back pain into left leg - not improving with home  exercise program, advil.  Now with numbness as well and radiation further down the leg.  Concerning for radiculopathy.  She will start prednisone dose pack, reviewed home exercise program for the back.  Consider physical therapy if not improving over next 1-2 weeks.  F/u in 1 month to 6 weeks.  Consider imaging as well if not improving.

## 2017-05-12 NOTE — Telephone Encounter (Signed)
Letter written and printed.

## 2017-05-17 ENCOUNTER — Encounter: Payer: Self-pay | Admitting: Family Medicine

## 2017-05-17 ENCOUNTER — Ambulatory Visit (INDEPENDENT_AMBULATORY_CARE_PROVIDER_SITE_OTHER): Admitting: Family Medicine

## 2017-05-17 VITALS — BP 120/80 | HR 73 | Temp 98.5°F | Ht 64.0 in | Wt 189.0 lb

## 2017-05-17 DIAGNOSIS — M545 Low back pain, unspecified: Secondary | ICD-10-CM

## 2017-05-17 MED ORDER — CYCLOBENZAPRINE HCL 10 MG PO TABS: 10.0000 mg | ORAL_TABLET | Freq: Three times a day (TID) | ORAL | 0 refills | Status: DC | PRN

## 2017-05-17 MED FILL — CYCLOBENZAPRINE HCL 10 MG T: 10 | 7 days supply | Qty: 20 | Fill #0

## 2017-05-17 NOTE — Progress Notes (Signed)
Pre visit review using our clinic review tool, if applicable. No additional management support is needed unless otherwise documented below in the visit note. 

## 2017-05-17 NOTE — Telephone Encounter (Signed)
Patient picked up letter.

## 2017-05-17 NOTE — Patient Instructions (Addendum)
Ibuprofen 400-600 mg (2-3 over the counter strength tabs) every 6 hours as needed for pain.  OK to take Tylenol 1000 mg (2 extra strength tabs) or 975 mg (3 regular strength tabs) every 6 hours as needed.  Take Flexeril (cyclobenzaprine) 1-2 hours before planned bedtime. If it makes you drowsy, do not take during the day. You can try half a tab the following night.  Heat (pad or rice pillow in microwave) over affected area, 10-15 minutes every 2-3 hours while awake.   Do the stretch we talked about in the office twice daily.   Let us know if you need anything.

## 2017-05-17 NOTE — Progress Notes (Signed)
Musculoskeletal Exam  Patient: [name] DOB: 25-Oct-1975  DOS: 05/17/2017  SUBJECTIVE:  Chief Complaint:   Chief Complaint  Patient presents with  . Hip Pain    [name] is a 42 y.o.  female for lbp. Onset:  8 days ago. Sudden.  Location: L Lower Character:  aching and sharp  Progression of issue:  is unchanged Associated symptoms: Radiates around rib Treatment: to date has been Prednisone.   Neurovascular symptoms: no   Needs letter for [employer] school stating she cannot do situps. Her sports med doc wrote letter for 6 weeks at a time and she needs dx's on it.   ROS: Musculoskeletal/Extremities: +LBP pain  Past Medical History:  Diagnosis Date  . Asthma   . Ovarian cyst rupture 09/08/2012   left    Objective: VITAL SIGNS: BP 120/80 (BP Location: Left Arm, Patient Position: Sitting, Cuff Size: Normal)   Pulse 73   Temp 98.5 F (36.9 C) (Oral)   Ht 5\' 4"  (1.626 m)   Wt 189 lb (85.7 kg)   SpO2 98%   BMI 32.44 kg/m  Constitutional: Well formed, well developed. No acute distress. Cardiovascular: Brisk cap refill Thorax & Lungs: No accessory muscle use Musculoskeletal: LBP   Normal active range of motion: no.   Normal passive range of motion: pain with rotation Tenderness to palpation: yes lower paraspinal msc on L Deformity: no Ecchymosis: no Tests positive: none Tests negative: Straight leg, Lesegue's Neurologic: Normal sensory function. No focal deficits noted. DTR's equal and symmetry in LE's. No clonus. Psychiatric: Normal mood. Age appropriate judgment and insight. Alert & oriented x 3.    Assessment:  Acute left-sided low back pain without sciatica - Plan: cyclobenzaprine (FLEXERIL) 10 MG tablet  Plan: Orders as above. Do not take during day if it makes drowsy. Heat. Ibuprofen. Stretch. Tylenol.  Letter written. F/u prn. The patient voiced understanding and agreement to the plan.   [place], DO 05/17/17  9:12 AM

## 2017-06-10 ENCOUNTER — Ambulatory Visit: Admitting: Family Medicine

## 2017-12-08 ENCOUNTER — Other Ambulatory Visit: Payer: Self-pay | Admitting: Family Medicine

## 2017-12-08 DIAGNOSIS — Z3041 Encounter for surveillance of contraceptive pills: Secondary | ICD-10-CM

## 2018-06-08 IMAGING — MR MR KNEE*L* W/O CM
7 series · 40 of 40 positions shown · non-contrast
Comparison: Radiographs dated 11/19/2016

CLINICAL DATA: Chronic left knee pain anterolaterally. Twisting
injury 8 years ago. Popping and instability.

EXAM:
MRI OF THE LEFT KNEE WITHOUT CONTRAST
TECHNIQUE: Multiplanar, multisequence MR imaging of the knee was performed. No
intravenous contrast was administered.

[Series 3: PD fat-sat · axial · 4.0mm · 0.50mm/px · z∈[-92,+17]mm · 7 of 23 slices shown (1 of 3)]
[im 1/23]
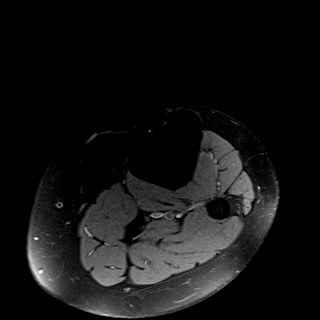
[im 4/23]
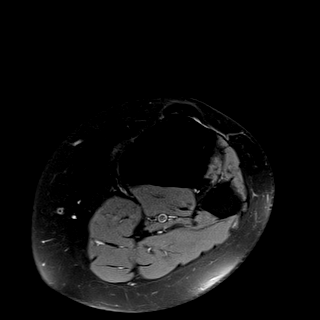
[im 8/23]
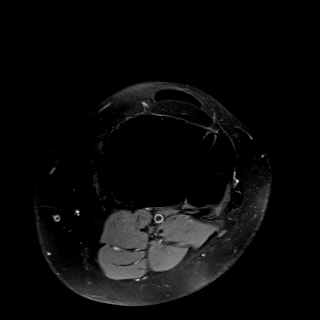
[im 12/23]
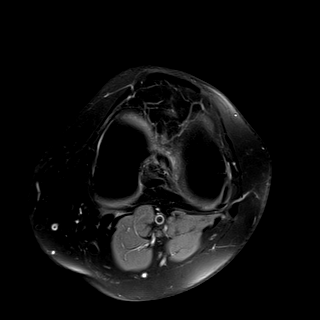
[im 15/23]
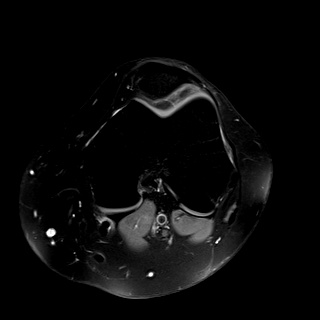
[im 19/23]
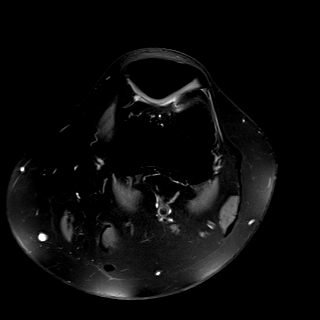
[im 23/23]
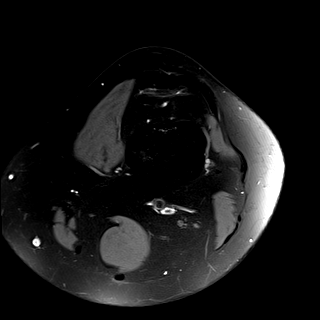

[Series 4: PD fat-sat · sagittal · 4.0mm · 0.50mm/px · 6 of 20 slices shown (2 of 3)]
[im 1/20]
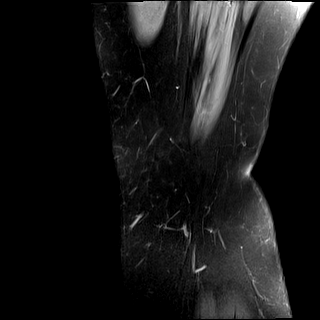
[im 4/20]
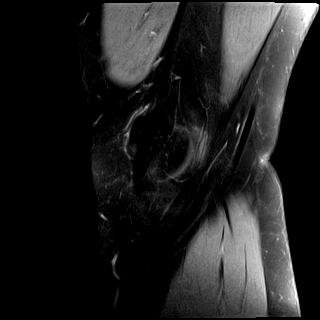
[im 8/20]
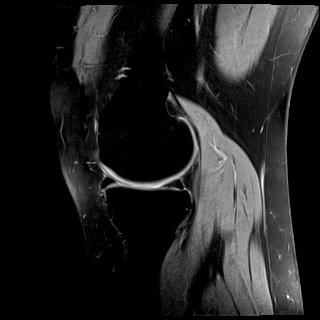
[im 12/20]
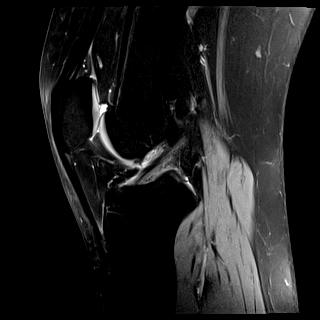
[im 16/20]
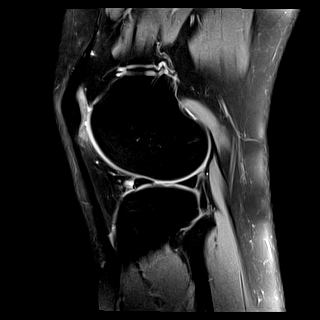
[im 20/20]
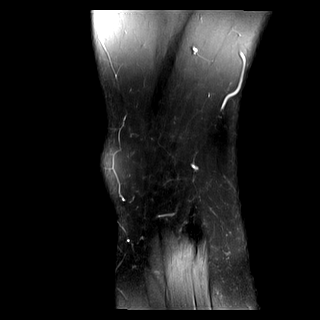

[Series 5: PD fat-sat · coronal · 4.0mm · 0.50mm/px · 7 of 23 slices shown (3 of 3)]
[im 1/23]
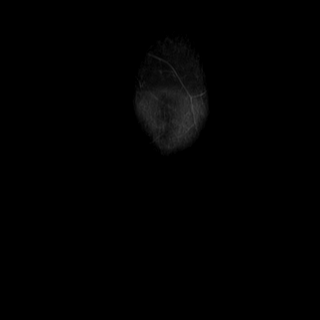
[im 4/23]
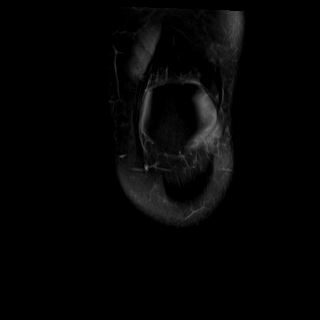
[im 8/23]
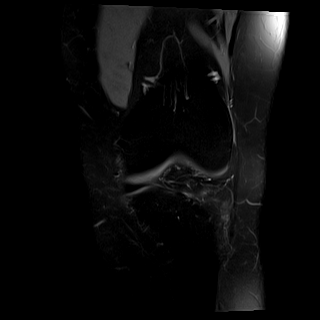
[im 12/23]
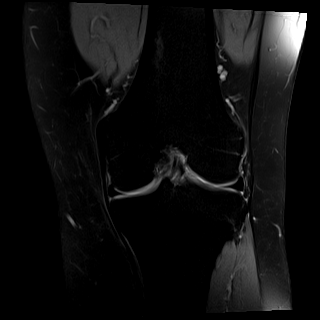
[im 15/23]
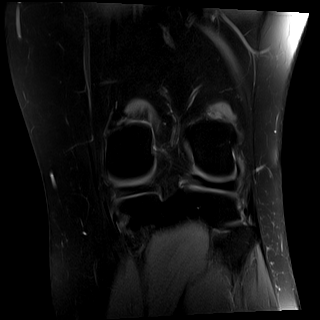
[im 19/23]
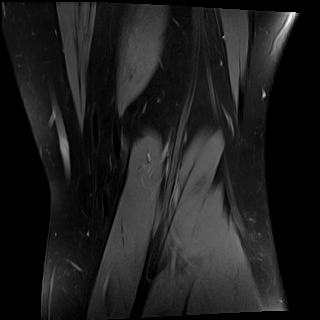
[im 23/23]
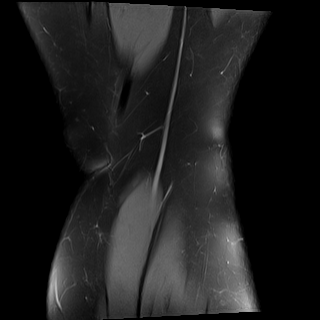

[Series 6: T2 fat-sat · coronal · 4.0mm · 0.50mm/px · 7 of 23 slices shown]
[im 1/23]
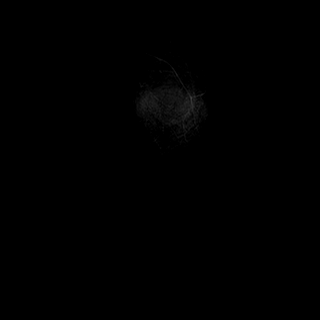
[im 4/23]
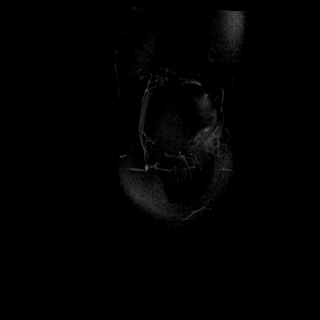
[im 8/23]
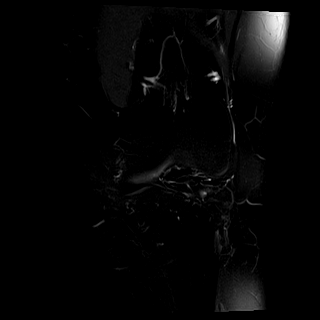
[im 12/23]
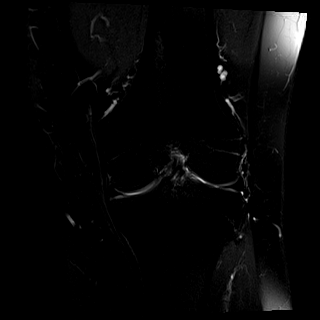
[im 15/23]
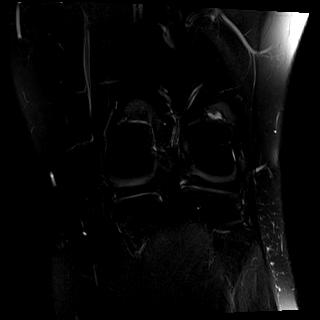
[im 19/23]
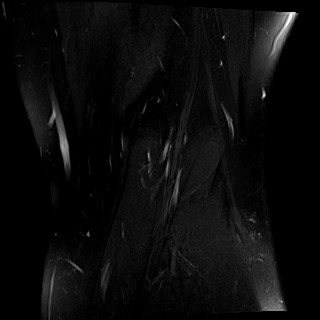
[im 23/23]
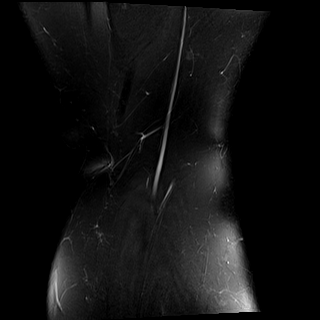

[Series 7: T1 · coronal · 4.0mm · 0.62mm/px · 7 of 23 slices shown]
[im 1/23]
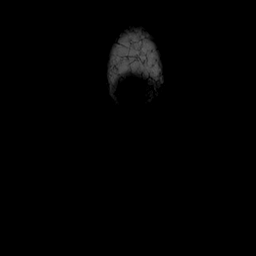
[im 4/23]
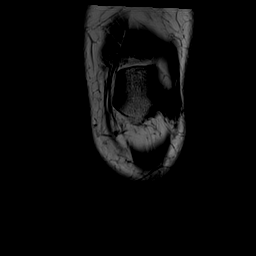
[im 8/23]
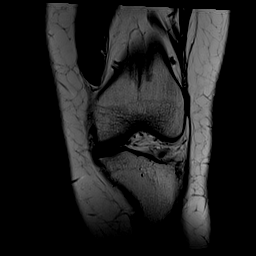
[im 12/23]
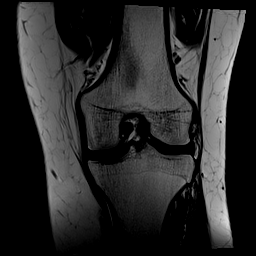
[im 15/23]
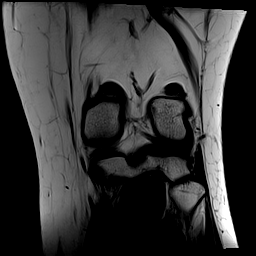
[im 19/23]
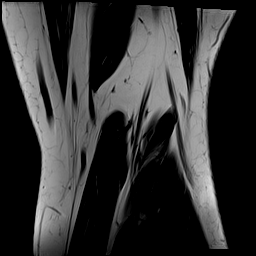
[im 23/23]
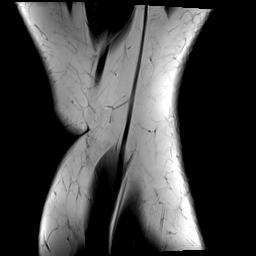

[Series 8: PD · coronal · 2.0mm · 0.50mm/px · 5 of 15 slices shown]
[im 1/15]
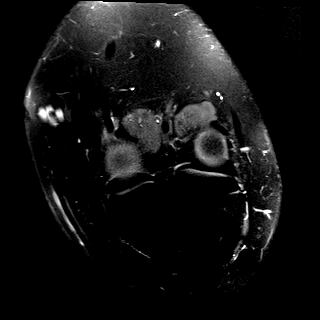
[im 4/15]
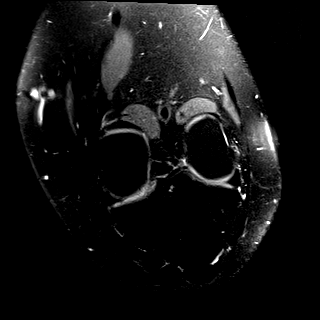
[im 8/15]
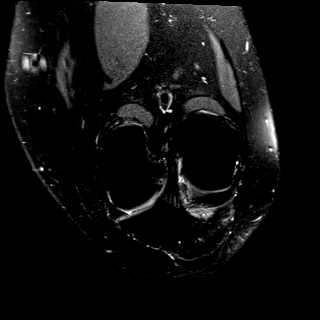
[im 11/15]
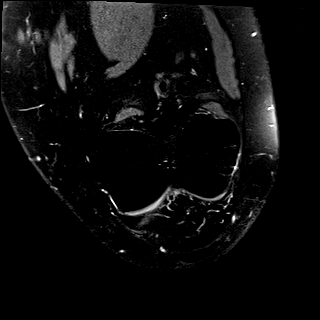
[im 15/15]
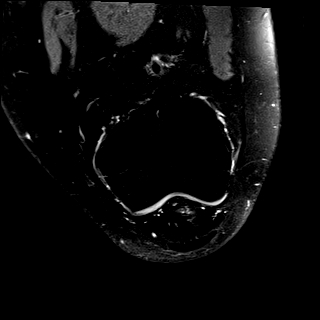

[Series 100: hx · axial · 8.0mm · 0.68mm/px · 1 of 1 slices shown]
[im 1/1]
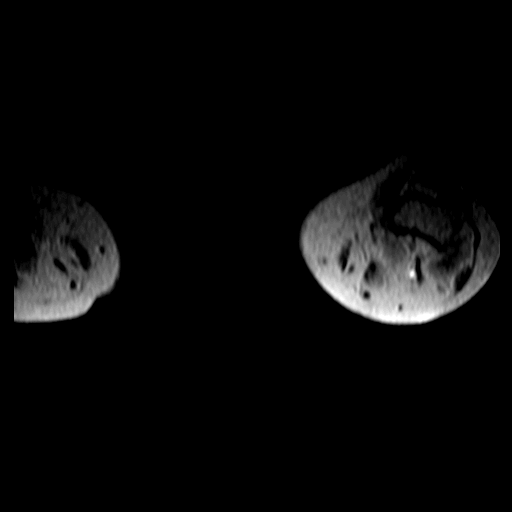

[40 of 40 positions shown; findings below may reference images not displayed]

FINDINGS: MENISCI

Medial meniscus:  Unremarkable

Lateral meniscus:  Unremarkable

LIGAMENTS

Cruciates:  Unremarkable

Collaterals: New distally indistinct and proximally thickened
fibular collateral ligament, without surrounding edema. Popliteus
tendon intact.

CARTILAGE

Patellofemoral:  Unremarkable

Medial:  Unremarkable

Lateral:  Unremarkable

Joint: On image [DATE] there is subtle synovitis in Hoffa' s fat pad
below the lateral patellar facet. There is a 1.0 by 0.5 by 0.6 cm
fluid collection anterior to the periphery of the anterior horn
lateral meniscus but without a visible associated meniscal tear.

Popliteal Fossa:  Very small Baker's cyst.

Extensor Mechanism:  Unremarkable

Bones: Very small lateral osteochondroma of the tibial metaphysis,
image [DATE], no significant cartilaginous cap.

Other: No supplemental non-categorized findings.
IMPRESSION: 1. Subtle focal synovitis in Hoffa's fat pad below the lateral
patellar facet -this could reflect low-grade patellar tendon-lateral
femoral condyle friction syndrome.
2. Distally indistinct and proximally thickened fibular collateral
ligament, query remote injury.
3. Small fluid collection anterior to the periphery of the anterior
horn lateral meniscus, but no visible meniscal tear to suggest
parameniscal cyst. Conceivably this could be a tiny ganglion cyst,
or simply a small collection of localized joint fluid.
4. Very small Baker's cyst.
5. Incidental small osteochondroma of the lateral portion of the
proximal tibial metaphysis.

## 2018-09-28 ENCOUNTER — Encounter: Payer: Self-pay | Admitting: Family Medicine

## 2018-09-28 ENCOUNTER — Ambulatory Visit (INDEPENDENT_AMBULATORY_CARE_PROVIDER_SITE_OTHER): Admitting: Family Medicine

## 2018-09-28 DIAGNOSIS — G8929 Other chronic pain: Secondary | ICD-10-CM

## 2018-09-28 DIAGNOSIS — Z0289 Encounter for other administrative examinations: Secondary | ICD-10-CM

## 2018-09-28 DIAGNOSIS — M25562 Pain in left knee: Secondary | ICD-10-CM

## 2018-09-28 NOTE — Progress Notes (Signed)
Chief Complaint  Patient presents with  . Follow-up    [employer] form to complete    Subjective: Patient is a 42 y.o. female here for L knee pain affecting PT for [employer]. Due to COVID-19 pandemic, we are interacting via web portal for an electronic face-to-face visit. I verified patient's ID using 2 identifiers. Patient agreed to proceed with visit via this method. Patient is at home, I am at office. Patient and I are present for visit.   Pt in [employer], has PT coming up. 6 events including a 2 mile run in 20 min, dead lifts, capping push ups, a rep of a hanging knee tuck, and lateral slides. She can squat, has never dead lifted. Running is OK, doing push ups is sometimes troublesome because her knee can lock up. No recent issues.   ROS: MSK: +knee pain   Past Medical History:  Diagnosis Date  . Asthma   . Ovarian cyst rupture 09/08/2012   left    Objective: No conversational dyspnea Age appropriate judgment and insight Nml affect and mood  Assessment and Plan: Chronic pain of left knee  Encounter for completion of form with patient  Form filled out. OK to do any of the above after discussing each "event" with the patient. I suggested she try some of them and if anything changes, I can excuse her from those exercises. Form faxed to home number at her request.  F/u for CPE at earliest convenience.  The patient voiced understanding and agreement to the plan.  [place], DO 09/28/18  1:53 PM

## 2018-10-17 ENCOUNTER — Encounter: Payer: Self-pay | Admitting: Family Medicine

## 2019-03-06 ENCOUNTER — Other Ambulatory Visit: Payer: Self-pay | Admitting: Family Medicine

## 2019-03-06 DIAGNOSIS — Z3041 Encounter for surveillance of contraceptive pills: Secondary | ICD-10-CM

## 2019-03-06 MED ORDER — NORETHINDRONE ACET-ETHINYL EST 1.5-30 MG-MCG PO TABS: 1.0000 | ORAL_TABLET | Freq: Every day | ORAL | 3 refills | Status: DC

## 2019-03-06 MED FILL — JUNEL 1.5-30 TABLET: 1.5-30 | 84 days supply | Qty: 84 | Fill #0

## 2019-03-06 NOTE — Telephone Encounter (Signed)
Requested medication (s) are due for refill today:   Yes  Requested medication (s) are on the active medication list:   Yes  Future visit scheduled:   No   Last ordered: 12/08/2017  #63  4 refills Do not see a recent visit pertaining to this medication.  Forwarded to Dr. Nani Ravens for disposition.   Requested Prescriptions  Pending Prescriptions Disp Refills   Norethindrone Acetate-Ethinyl Estradiol (MICROGESTIN) 1.5-30 MG-MCG tablet 63 tablet 4    Sig: Take 1 tablet by mouth daily.     OB/GYN:  Contraceptives Passed - 03/06/2019  2:41 PM      Passed - Last BP in normal range    BP Readings from Last 1 Encounters:  05/17/17 120/80         Passed - Valid encounter within last 12 months    Recent Outpatient Visits          5 months ago Chronic pain of left knee   [facility] [place], [place]   1 year ago Acute left-sided low back pain without sciatica   [facility] at [place], [place]   1 year ago Left hip pain   [facility] at [place], [place]   2 years ago Chronic pain of left knee   [facility] at [place], [place]   2 years ago Chronic pain of left knee   [facility] at [place], [place], [place]

## 2019-03-06 NOTE — Telephone Encounter (Signed)
Medication Refill - Medication: MICROGESTIN 1.5-30 MG-MCG tablet [id number]    Preferred Pharmacy (with phone number or street name):  [facility] [place]  [address] [place] [address]  Phone: [phone] Fax: [phone]     Agent: Please be advised that RX refills may take up to 3 business days. We ask that you follow-up with your pharmacy.

## 2019-05-02 ENCOUNTER — Ambulatory Visit: Payer: Federal, State, Local not specified - PPO | Attending: Internal Medicine

## 2019-05-02 DIAGNOSIS — Z20822 Contact with and (suspected) exposure to covid-19: Secondary | ICD-10-CM

## 2019-05-03 LAB — NOVEL CORONAVIRUS, NAA: SARS-CoV-2, NAA: NOT DETECTED

## 2019-07-13 MED FILL — JUNEL 1.5-30 TABLET: 1.5-30 | 84 days supply | Qty: 84 | Fill #1

## 2019-08-10 ENCOUNTER — Ambulatory Visit: Payer: Self-pay | Attending: Internal Medicine

## 2019-08-10 DIAGNOSIS — Z23 Encounter for immunization: Secondary | ICD-10-CM

## 2019-08-10 NOTE — Progress Notes (Signed)
   Covid-19 Vaccination Clinic  Name:  [name]    MRN: [id number] DOB: Jun 30, 1975  08/10/2019  [name] was observed post Covid-19 immunization for 15 minutes without incident. She was provided with Vaccine Information Sheet and instruction to access the V-Safe system.   [name] was instructed to call 911 with any severe reactions post vaccine: Marland Kitchen Difficulty breathing  . Swelling of face and throat  . A fast heartbeat  . A bad rash all over body  . Dizziness and weakness   Immunizations Administered    Name Date Dose VIS Date Route   Pfizer COVID-19 Vaccine 08/10/2019 11:16 AM 0.3 mL 04/21/2019 Intramuscular   Manufacturer: [place]   Lot: [id number]   [place]: [id number]

## 2019-09-06 ENCOUNTER — Ambulatory Visit: Payer: Self-pay | Attending: Internal Medicine

## 2019-09-06 DIAGNOSIS — Z23 Encounter for immunization: Secondary | ICD-10-CM

## 2019-09-06 NOTE — Progress Notes (Signed)
   Covid-19 Vaccination Clinic  Name:  [name]    MRN: [id number] DOB: January 04, 1976  09/06/2019  [name] was observed post Covid-19 immunization for 15 minutes without incident. She was provided with Vaccine Information Sheet and instruction to access the V-Safe system.   [name] was instructed to call 911 with any severe reactions post vaccine: Marland Kitchen Difficulty breathing  . Swelling of face and throat  . A fast heartbeat  . A bad rash all over body  . Dizziness and weakness   Immunizations Administered    Name Date Dose VIS Date Route   Pfizer COVID-19 Vaccine 09/06/2019  8:38 AM 0.3 mL 07/05/2018 Intramuscular   Manufacturer: [place]   Lot: [id number]   [place]: [id number]

## 2019-10-12 MED FILL — JUNEL 1.5-30 TABLET: 1.5-30 | 84 days supply | Qty: 84 | Fill #2

## 2020-01-31 ENCOUNTER — Telehealth: Payer: Self-pay | Admitting: Family Medicine

## 2020-01-31 NOTE — Telephone Encounter (Signed)
Pt had [employer] form up front from June 2020  I mailed it off to her home address

## 2020-02-29 MED FILL — JUNEL 1.5-30 TABLET: 1.5-30 | 84 days supply | Qty: 84 | Fill #3

## 2020-06-25 ENCOUNTER — Encounter: Payer: Self-pay | Admitting: Family Medicine

## 2020-06-25 ENCOUNTER — Other Ambulatory Visit (HOSPITAL_COMMUNITY): Payer: Self-pay | Admitting: Family Medicine

## 2020-06-25 ENCOUNTER — Other Ambulatory Visit: Payer: Self-pay

## 2020-06-25 ENCOUNTER — Ambulatory Visit: Payer: Federal, State, Local not specified - PPO | Admitting: Family Medicine

## 2020-06-25 VITALS — BP 120/80 | HR 85 | Temp 98.3°F | Ht 64.0 in | Wt 191.2 lb

## 2020-06-25 DIAGNOSIS — Z3041 Encounter for surveillance of contraceptive pills: Secondary | ICD-10-CM | POA: Diagnosis not present

## 2020-06-25 DIAGNOSIS — M76892 Other specified enthesopathies of left lower limb, excluding foot: Secondary | ICD-10-CM

## 2020-06-25 DIAGNOSIS — M25562 Pain in left knee: Secondary | ICD-10-CM

## 2020-06-25 DIAGNOSIS — G8929 Other chronic pain: Secondary | ICD-10-CM

## 2020-06-25 DIAGNOSIS — S76012A Strain of muscle, fascia and tendon of left hip, initial encounter: Secondary | ICD-10-CM

## 2020-06-25 MED ORDER — MELOXICAM 15 MG PO TABS
15.0000 mg | ORAL_TABLET | Freq: Every day | ORAL | 2 refills | Status: DC
Start: 2020-06-25 — End: 2022-04-15

## 2020-06-25 MED ORDER — NORETHINDRONE ACET-ETHINYL EST 1.5-30 MG-MCG PO TABS: 1.0000 | ORAL_TABLET | Freq: Every day | ORAL | 3 refills | Status: DC

## 2020-06-25 MED FILL — JUNEL 1.5-30 TABLET: 1.5-30 | 84 days supply | Qty: 84 | Fill #0

## 2020-06-25 MED FILL — MELOXICAM 15 MG TABLET: 15 | 30 days supply | Qty: 30 | Fill #0

## 2020-06-25 NOTE — Progress Notes (Signed)
Chief Complaint  Patient presents with  . Hip Pain  . Knee Pain    Subjective: Patient is a 45 y.o. female here for f/u.  Patient is following up for chronic hip flexor pain and gluteal pain.  She is seen sports medicine in the past and has undergone 2 rounds of physical therapy.  She is compliant with home exercise programs and stretches.  Things are getting worse.  She is unable to run long distances.  She needs alternative options for her [employer] training protocols.  Past Medical History:  Diagnosis Date  . Asthma   . Ovarian cyst rupture 09/08/2012   left    Objective: BP 120/80 (BP Location: Left Arm, Patient Position: Sitting, Cuff Size: Normal)   Pulse 85   Temp 98.3 F (36.8 C) (Oral)   Ht 5\' 4"  (1.626 m)   Wt 191 lb 4 oz (86.8 kg)   SpO2 99%   BMI 32.83 kg/m  General: Awake, appears stated age Lungs: No accessory muscle use MSK: There is tenderness to palpation of the left hip flexor and greater trochanteric bursa; there is no deformity or edema: Positive Stinchfield, negative logroll, FADDIR, FABER, Ober's; there is no pain reproduced with resisted left hip abduction Neuro: No cerebellar signs, antalgic gait Psych: Age appropriate judgment and insight, normal affect and mood  Assessment and Plan: Hip flexor tendinitis, left - Plan: meloxicam (MOBIC) 15 MG tablet, Ambulatory referral to Sports Medicine  Muscle strain of gluteal region, left, initial encounter - Plan: meloxicam (MOBIC) 15 MG tablet, Ambulatory referral to Sports Medicine  Encounter for surveillance of contraceptive pills - Plan: Norethindrone Acetate-Ethinyl Estradiol (MICROGESTIN) 1.5-30 MG-MCG tablet  Chronic pain of left knee - Plan: meloxicam (MOBIC) 15 MG tablet  I would like her to see Dr. Tamala Julian of the sports medicine team.  We will place referral for his opinion.  Heat, ice, Tylenol, meloxicam.  Stretches and exercises for the IT band provided. Follow-up as convenient for her physical. The  patient voiced understanding and agreement to the plan.  [place], DO 06/25/20  11:06 AM

## 2020-06-25 NOTE — Patient Instructions (Signed)
Ice/cold pack over area for 10-15 min twice daily.  Heat (pad or rice pillow in microwave) over affected area, 10-15 minutes twice daily.   OK to take Tylenol 1000 mg (2 extra strength tabs) or 975 mg (3 regular strength tabs) every 6 hours as needed.  Hold on NSAIDs while on meloxicam.  If you do not hear anything about your referral in the next 1-2 weeks, call our office and ask for an update.  Let us know if you need anything.  Iliotibial Band Syndrome Rehab It is normal to feel mild stretching, pulling, tightness, or discomfort as you do these exercises, but you should stop right away if you feel sudden pain or your pain gets worse.  Stretching and range of motion exercises These exercises warm up your muscles and joints and improve the movement and flexibility of your hip and pelvis. Exercise A: Quadriceps, prone    1. Lie on your abdomen on a firm surface, such as a bed or padded floor. 2. Bend your left / right knee and hold your ankle. If you cannot reach your ankle or pant leg, loop a belt around your foot and grab the belt instead. 3. Gently pull your heel toward your buttocks. Your knee should not slide out to the side. You should feel a stretch in the front of your thigh and knee. 4. Hold this position for 30 seconds. Repeat 2 times. Complete this stretch 3 times per week. Exercise B: Iliotibial band    1. Lie on your side with your left / right leg in the top position. 2. Bend both of your knees and grab your left / right ankle. Stretch out your bottom arm to help you balance. 3. Slowly bring your top knee back so your thigh goes behind your trunk. 4. Slowly lower your top leg toward the floor until you feel a gentle stretch on the outside of your left / right hip and thigh. If you do not feel a stretch and your knee will not fall farther, place the heel of your other foot on top of your knee and pull your knee down toward the floor with your foot. 5. Hold this position  for 30 seconds. Repeat 2 times. Complete this stretch 3 times per week. Strengthening exercises These exercises build strength and endurance in your hip and pelvis. Endurance is the ability to use your muscles for a long time, even after they get tired. Exercise C: Straight leg raises (hip abductors)     1. Lie on your side with your left / right leg in the top position. Lie so your head, shoulder, knee, and hip line up. You may bend your bottom knee to help you balance. 2. Roll your hips slightly forward so your hips are stacked directly over each other and your left / right knee is facing forward. 3. Tense the muscles in your outer thigh and lift your top leg 4-6 inches (10-15 cm). 4. Hold this position for 3 seconds. Repeat for a total of 10 reps. 5. Slowly return to the starting position. Let your muscles relax completely before doing another repetition. Repeat 2 times. Complete this exercise 3 times per week. Exercise D: Straight leg raises (hip extensors) 1. Lie on your abdomen on your bed or a firm surface. You can put a pillow under your hips if that is more comfortable. 2. Bend your left / right knee so your foot is straight up in the air. 3. Squeeze your buttock muscles and  lift your left / right thigh off the bed. Do not let your back arch. 4. Tense this muscle as hard as you can without increasing any knee pain. 5. Hold this position for 2 seconds. Repeat for a total of 10 reps 6. Slowly lower your leg to the starting position and allow it to relax completely. Repeat 2 times. Complete this exercise 3 times per week. Exercise E: Hip hike 1. Stand sideways on a bottom step. Stand on your left / right leg with your other foot unsupported next to the step. You can hold onto the railing or wall if needed for balance. 2. Keep your knees straight and your torso square. Then, lift your left / right hip up toward the ceiling. 3. Slowly let your left / right hip lower toward the floor, past  the starting position. Your foot should get closer to the floor. Do not lean or bend your knees. Repeat 2 times. Complete this exercise 3 times per week.  Document Released: 04/27/2005 Document Revised: 12/31/2015 Document Reviewed: 03/29/2015 Elsevier Interactive Patient Education  [employer].

## 2020-07-31 NOTE — Progress Notes (Signed)
[place] Phone: [phone] Subjective:   [name], am serving as a scribe for Dr. Hulan Saas. This visit occurred during the SARS-CoV-2 public health emergency.  Safety protocols were in place, including screening questions prior to the visit, additional usage of staff PPE, and extensive cleaning of exam room while observing appropriate contact time as indicated for disinfecting solutions.   I'm seeing this patient by the request  of:  Shelda Pal, DO  CC: Left knee pain  [id number]  [name] is a 45 y.o. female coming in with complaint of left glute and B knee pain. Patient has been doing physical therapy. Pain in left hip radiates from A to P. Patient has constant pain in L hip. Does have tingling in L toes.   Chronic B knee pain over medial aspect. Patient is in the [employer]. L>R.   Uses ice, heat, elevation and topical analgesic. Patient uses a standing desk at work. Advil prn. Meloxicam when pain is at its worst but does not feel it helps.   Reviewed patient's chart in great entirety.  Patient did have an MRI of the knee done in 2018.  Patient did have thickening of the fibular collateral ligament noted but there was also what appeared to be a potential ganglion cyst on the periphery of the anterior horn of the lateral meniscus.  Also had a questionable osteochondroma noted of the tibial metaphysis.    Past Medical History:  Diagnosis Date  . Asthma   . Ovarian cyst rupture 09/08/2012   left   Past Surgical History:  Procedure Laterality Date  . APPENDECTOMY    . OVARIAN CYST REMOVAL Right 09/08/2012   drained and removed   Social History   Socioeconomic History  . Marital status: Married    Spouse name: Delanda Bulluck.   . Number of children: 2  . Years of education: Not on file  . Highest education level: Not on file  Occupational History  . Occupation: Naval architect: [employer]  Tobacco Use  . Smoking status: Never Smoker  . Smokeless tobacco: Never Used  Substance and Sexual Activity  . Alcohol use: Collins    Alcohol/week: 0.0 standard drinks  . Drug use: Collins  . Sexual activity: Yes    Partners: Male    Birth control/protection: Pill  Other Topics Concern  . Not on file  Social History Narrative  . Not on file   Social Determinants of Health   Financial Resource Strain: Not on file  Food Insecurity: Not on file  Transportation Needs: Not on file  Physical Activity: Not on file  Stress: Not on file  Social Connections: Not on file   Allergies  Allergen Reactions  . Acetaminophen Hives  . Bee Venom Hives, Itching and Swelling   Family History  Problem Relation Age of Onset  . Cancer Neg Hx     Current Outpatient Medications (Endocrine & Metabolic):  Marland Kitchen  Norethindrone Acetate-Ethinyl Estradiol (MICROGESTIN) 1.5-30 MG-MCG tablet, Take 1 tablet by mouth daily. .  predniSONE (DELTASONE) 20 MG tablet, Take 1 tablet (20 mg total) by mouth daily with breakfast.    Current Outpatient Medications (Analgesics):  .  meloxicam (MOBIC) 15 MG tablet, Take 1 tablet (15 mg total) by mouth daily.     Reviewed prior external information including notes and imaging from  primary care provider As well as notes that  were available from care everywhere and other healthcare systems.  Past medical history, social, surgical and family history all reviewed in electronic medical record.  Collins pertanent information unless stated regarding to the chief complaint.   Review of Systems:  Collins headache, visual changes, nausea, vomiting, diarrhea, constipation, dizziness, abdominal pain, skin rash, fevers, chills, night sweats, weight loss, swollen lymph nodes, body aches, joint swelling, chest pain, shortness of breath, mood changes. POSITIVE muscle aches  Objective  Blood pressure 110/72, pulse 87, height 5\' 4"  (1.626 m), weight  191 lb (86.6 kg), last menstrual period 07/18/2020, SpO2 98 %.   General: Collins apparent distress alert and oriented x3 mood and affect normal, dressed appropriately.  HEENT: Pupils equal, extraocular movements intact  Respiratory: Patient's speak in full sentences and does not appear short of breath  Cardiovascular: Collins lower extremity edema, non tender, Collins erythema  Gait normal with good balance and coordination.  MSK: Knee exam shows the patient does have very mild lateral tracking of the patella noted on the left knee.  Patient does have mild crepitus noted.  Moderate to severe tenderness over the anterior lateral aspect of the joint.  Minimal pain over the medial aspect.  Patient does have significant pes planus with overpronation of the hindfoot. Left hip shows that patient does have fairly good range of motion.  Patient has tenderness to palpation over the greater trochanteric area.  Negative straight leg test.  Limited musculoskeletal ultrasound was performed and interpreted by Lyndal Pulley  Limited ultrasound shows the patient does have mild narrowing of the patellofemoral joint noted but Collins significant inflammation noted today.  Patient though does have what appears to be a possible cyst formation in the anterior lateral meniscus.  Patient with compression in this area does have discomfort. Impression: Questionable cyst within the meniscus.  [id number]; 15 additional minutes spent for Therapeutic exercises as stated in above notes.  This included exercises focusing on stretching, strengthening, with significant focus on eccentric aspects.   Long term goals include an improvement in range of motion, strength, endurance as well as avoiding reinjury. Patient's frequency would include in 1-2 times a day, 3-5 times a week for a duration of 6-12 weeks. Reviewed anatomy using anatomical model and how PFS occurs.  Given rehab exercises handout for VMO, hip abductors, core, entire kinetic chain including  proprioception exercises.  Could benefit from PT, regular exercise, upright biking, and a PFS knee brace to assist with tracking abnormalities.   Proper technique shown and discussed handout in great detail with ATC.  All questions were discussed and answered.      Impression and Recommendations:     The above documentation has been reviewed and is accurate and complete Lyndal Pulley, DO

## 2020-08-01 ENCOUNTER — Other Ambulatory Visit: Payer: Self-pay

## 2020-08-01 ENCOUNTER — Ambulatory Visit: Payer: Federal, State, Local not specified - PPO | Admitting: Family Medicine

## 2020-08-01 ENCOUNTER — Ambulatory Visit (INDEPENDENT_AMBULATORY_CARE_PROVIDER_SITE_OTHER): Payer: Federal, State, Local not specified - PPO

## 2020-08-01 ENCOUNTER — Encounter: Payer: Self-pay | Admitting: Family Medicine

## 2020-08-01 VITALS — BP 110/72 | HR 87 | Ht 64.0 in | Wt 191.0 lb

## 2020-08-01 DIAGNOSIS — M25562 Pain in left knee: Secondary | ICD-10-CM

## 2020-08-01 DIAGNOSIS — M216X9 Other acquired deformities of unspecified foot: Secondary | ICD-10-CM | POA: Diagnosis not present

## 2020-08-01 DIAGNOSIS — M25552 Pain in left hip: Secondary | ICD-10-CM | POA: Diagnosis not present

## 2020-08-01 DIAGNOSIS — G8929 Other chronic pain: Secondary | ICD-10-CM

## 2020-08-01 DIAGNOSIS — M25561 Pain in right knee: Secondary | ICD-10-CM

## 2020-08-01 DIAGNOSIS — M1612 Unilateral primary osteoarthritis, left hip: Secondary | ICD-10-CM | POA: Diagnosis not present

## 2020-08-01 MED ORDER — PREDNISONE 20 MG PO TABS: 20.0000 mg | ORAL_TABLET | Freq: Every day | ORAL | 0 refills | Status: DC

## 2020-08-01 NOTE — Assessment & Plan Note (Signed)
Bilateral noted.  Discussed with patient at great length about icing regimen and home exercises.  Discussed with patient over-the-counter orthotics that I think will be beneficial.  Likely contributing to some of the malalignment of the knee.

## 2020-08-01 NOTE — Patient Instructions (Addendum)
Good to see you Looks like left knee has a cyst in lateal mensicus Prednisone 20 mg daily for the next 5 days NO other antiinflammatories with the prednisone Pennsaid 2 times a day Recover sandals hoka oofos Spenco orthotics "total support" Exercise 3 times a day See me again in 6-7 weeks to discuss other options if still having issues

## 2020-08-01 NOTE — Assessment & Plan Note (Signed)
Left hip pain is likely compensation to more of the knee pain.  Discussed icing regimen Exercises.  Discussed the weight loss.  Patient given some mild exercises but will focus mostly on the knee today.  Follow-up with me again 6 months.

## 2020-08-01 NOTE — Assessment & Plan Note (Signed)
Patient does have bilateral seem to be more lateral tracking with some crepitus noted of the patellofemoral.  In addition to this though patient does have what appears to be a potential cyst in the lateral meniscus.  Does seem to be within the body.  This is somewhat concerning likely more of a chronic issue that is secondary to an acute tear previously.  Discussed with patient about potential injection, MRIs and physical therapy again.  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  Patient will follow up at that time.  If you continue to have trouble we need to consider advanced imaging or more aggressive therapy.

## 2020-09-02 NOTE — Progress Notes (Signed)
[name] [place] Phone: [phone] Subjective:   Fontaine No, am serving as a scribe for Dr. Hulan Saas. This visit occurred during the SARS-CoV-2 public health emergency.  Safety protocols were in place, including screening questions prior to the visit, additional usage of staff PPE, and extensive cleaning of exam room while observing appropriate contact time as indicated for disinfecting solutions.   I'm seeing this patient by the request  of:  Shelby Pal, DO  CC: Bilateral foot, left knee and bilateral hip pain  [id number]   08/01/2020 Bilateral noted.  Discussed with patient at great length about icing regimen and home exercises.  Discussed with patient over-the-counter orthotics that I think will be beneficial.  Likely contributing to some of the malalignment of the knee.  Left hip pain is likely compensation to more of the knee pain.  Discussed icing regimen Exercises.  Discussed the weight loss.  Patient given some mild exercises but will focus mostly on the knee today.  Follow-up with me again 6 months.  Update  [name] is a 45 y.o. female coming in with complaint of left hip and B knee, L ankle pain. Patient states that she is the same as last visit. Continues to have L hip and L knee pain while doing PT through the [facility]. R knee pain is more intermittent.  Patient states that if anything the left knee seems to be more intermittent and worsening at this point.  Patient states that it is affecting some of her daily activities.  Patient continues to have foot pain with weightbearing that is intermittent.     Patient did have an MRI of the left knee done in 2018 that did show some focal synovitis of the knee and signs and symptoms consistent with more of the patellofemoral syndrome.  Past Medical History:  Diagnosis Date  . Asthma   . Ovarian cyst rupture 09/08/2012   left   Past  Surgical History:  Procedure Laterality Date  . APPENDECTOMY    . OVARIAN CYST REMOVAL Right 09/08/2012   drained and removed   Social History   Socioeconomic History  . Marital status: Married    Spouse name: [name].   . Number of children: 2  . Years of education: Not on file  . Highest education level: Not on file  Occupational History  . Occupation: Theatre stage manager: [employer]  Tobacco Use  . Smoking status: Never Smoker  . Smokeless tobacco: Never Used  Substance and Sexual Activity  . Alcohol use: No    Alcohol/week: 0.0 standard drinks  . Drug use: No  . Sexual activity: Yes    Partners: Male    Birth control/protection: Pill  Other Topics Concern  . Not on file  Social History Narrative  . Not on file   Social Determinants of Health   Financial Resource Strain: Not on file  Food Insecurity: Not on file  Transportation Needs: Not on file  Physical Activity: Not on file  Stress: Not on file  Social Connections: Not on file   Allergies  Allergen Reactions  . Acetaminophen Hives  . Bee Venom Hives, Itching and Swelling   Family History  Problem Relation Age of Onset  . Cancer Neg Hx     Current Outpatient Medications (Endocrine & Metabolic):  [name]  Norethindrone Acetate-Ethinyl Estradiol (LOESTRIN) 1.5-30 MG-MCG tablet, TAKE 1 TABLET BY MOUTH DAILY. [name]  Norethindrone Acetate-Ethinyl Estradiol (MICROGESTIN) 1.5-30 MG-MCG tablet, Take 1 tablet by mouth daily. .  predniSONE (DELTASONE) 20 MG tablet, Take 1 tablet (20 mg total) by mouth daily with breakfast.    Current Outpatient Medications (Analgesics):  .  meloxicam (MOBIC) 15 MG tablet, Take 1 tablet (15 mg total) by mouth daily. .  meloxicam (MOBIC) 15 MG tablet, TAKE 1 TABLET (15 MG TOTAL) BY MOUTH DAILY.     Reviewed prior external information including notes and imaging from  primary care provider As well as notes that were available from care everywhere and  other healthcare systems.  Past medical history, social, surgical and family history all reviewed in electronic medical record.  No pertanent information unless stated regarding to the chief complaint.   Review of Systems:  No headache, visual changes, nausea, vomiting, diarrhea, constipation, dizziness, abdominal pain, skin rash, fevers, chills, night sweats, weight loss, swollen lymph nodes, body aches, joint swelling, chest pain, shortness of breath, mood changes. POSITIVE muscle aches  Objective  Blood pressure 102/64, pulse 87, height 5\' 4"  (1.626 m), weight 191 lb (86.6 kg), SpO2 99 %.   General: No apparent distress alert and oriented x3 mood and affect normal, dressed appropriately.  HEENT: Pupils equal, extraocular movements intact  Respiratory: Patient's speak in full sentences and does not appear short of breath  Cardiovascular: No lower extremity edema, non tender, no erythema  Gait very minorly antalgic Patient is having severe tenderness of the anterior aspect of the knee more on the anterior aspect of the patella and the patellar tendon.  Patient does have involuntary guarding noted.  Fullness noted in the fat pad area.  Tender to palpation of the medial joint space.  Severe crepitus and lateral tracking of the patella noted.  Patient does have some even what feels to be instability of the kneecap as well as potentially even the knee with valgus stress.   Impression and Recommendations:     The above documentation has been reviewed and is accurate and complete Shelby Pulley, DO

## 2020-09-03 ENCOUNTER — Ambulatory Visit: Payer: Federal, State, Local not specified - PPO | Admitting: Family Medicine

## 2020-09-03 ENCOUNTER — Encounter: Payer: Self-pay | Admitting: Family Medicine

## 2020-09-03 ENCOUNTER — Other Ambulatory Visit: Payer: Self-pay

## 2020-09-03 VITALS — BP 102/64 | HR 87 | Ht 64.0 in | Wt 191.0 lb

## 2020-09-03 DIAGNOSIS — G8929 Other chronic pain: Secondary | ICD-10-CM | POA: Diagnosis not present

## 2020-09-03 DIAGNOSIS — M25562 Pain in left knee: Secondary | ICD-10-CM | POA: Diagnosis not present

## 2020-09-03 NOTE — Assessment & Plan Note (Signed)
Patient's knee seems to be worse.  Worsening lateral tracking and crepitus of the knee.  Patient in addition has some instability noted which is concerning.  Patient is not responding to the formal physical therapy she is done, oral anti-inflammatories, short course of anti-inflammatories.  Concerned as well of potentially cyst formation noted in the anterior fat pad or potentially chronic synovitis or something such as polyvillonodular synovitis.  At this point I do feel that advanced imaging is warranted.  Patient has been treated for multiple months, greater than 8 months, with no significant benefit.  We will get the MRI and further evaluate and see what changes need to be made.

## 2020-09-03 NOTE — Patient Instructions (Signed)
MRI L knee [phone] Start exercises A lot is due to compensation Will write to you after we get results

## 2020-09-08 ENCOUNTER — Other Ambulatory Visit: Payer: Self-pay

## 2020-09-08 ENCOUNTER — Ambulatory Visit
Admission: RE | Admit: 2020-09-08 | Discharge: 2020-09-08 | Disposition: A | Discharge status: Home / Self Care | Payer: Federal, State, Local not specified - PPO | Source: Ambulatory Visit | Attending: Family Medicine | Admitting: Family Medicine

## 2020-09-08 DIAGNOSIS — M25562 Pain in left knee: Secondary | ICD-10-CM | POA: Diagnosis not present

## 2020-09-08 DIAGNOSIS — G8929 Other chronic pain: Secondary | ICD-10-CM

## 2020-09-12 NOTE — Telephone Encounter (Signed)
Form printed off and placed in Wendling's basket

## 2020-09-14 DIAGNOSIS — Z9911 Dependence on respirator [ventilator] status: Secondary | ICD-10-CM | POA: Diagnosis not present

## 2020-09-14 DIAGNOSIS — J4531 Mild persistent asthma with (acute) exacerbation: Secondary | ICD-10-CM | POA: Diagnosis not present

## 2020-09-14 DIAGNOSIS — R451 Restlessness and agitation: Secondary | ICD-10-CM | POA: Diagnosis not present

## 2020-09-14 DIAGNOSIS — J9602 Acute respiratory failure with hypercapnia: Secondary | ICD-10-CM | POA: Diagnosis not present

## 2020-11-06 ENCOUNTER — Other Ambulatory Visit (HOSPITAL_BASED_OUTPATIENT_CLINIC_OR_DEPARTMENT_OTHER): Payer: Self-pay

## 2020-11-07 ENCOUNTER — Telehealth: Payer: Self-pay

## 2020-11-07 NOTE — Telephone Encounter (Signed)
Patient called stating she never received her updated [employer] form from [place] and needs it signed and emailed over to    [email or web address]

## 2020-11-08 NOTE — Telephone Encounter (Signed)
Document printed and emailed.

## 2021-01-14 ENCOUNTER — Other Ambulatory Visit (HOSPITAL_BASED_OUTPATIENT_CLINIC_OR_DEPARTMENT_OTHER): Payer: Self-pay

## 2021-02-24 ENCOUNTER — Other Ambulatory Visit (HOSPITAL_BASED_OUTPATIENT_CLINIC_OR_DEPARTMENT_OTHER): Payer: Self-pay

## 2021-02-24 MED FILL — Meloxicam Tab 15 MG: ORAL | 30 days supply | Qty: 30 | Fill #0 | Status: AC

## 2021-07-14 ENCOUNTER — Other Ambulatory Visit: Payer: Self-pay | Admitting: Family Medicine

## 2021-10-19 ENCOUNTER — Other Ambulatory Visit: Payer: Self-pay | Admitting: Family Medicine

## 2021-11-18 ENCOUNTER — Other Ambulatory Visit: Payer: Self-pay

## 2022-01-15 ENCOUNTER — Other Ambulatory Visit: Payer: Self-pay | Admitting: Family Medicine

## 2022-04-07 ENCOUNTER — Other Ambulatory Visit: Payer: Self-pay | Admitting: Family Medicine

## 2022-04-07 NOTE — Telephone Encounter (Signed)
Patient last seen by PCP was 06/25/2020 Needs appointment for further refills. Called left a detailed message to call back to schedule an appointment

## 2022-04-15 ENCOUNTER — Other Ambulatory Visit: Payer: Self-pay | Admitting: Family Medicine

## 2022-04-15 ENCOUNTER — Encounter: Payer: Self-pay | Admitting: Family Medicine

## 2022-04-15 ENCOUNTER — Ambulatory Visit (INDEPENDENT_AMBULATORY_CARE_PROVIDER_SITE_OTHER): Payer: Federal, State, Local not specified - PPO | Admitting: Family Medicine

## 2022-04-15 VITALS — BP 120/72 | HR 87 | Temp 98.2°F | Ht 65.0 in | Wt 201.2 lb

## 2022-04-15 DIAGNOSIS — M25562 Pain in left knee: Secondary | ICD-10-CM | POA: Diagnosis not present

## 2022-04-15 DIAGNOSIS — Z1159 Encounter for screening for other viral diseases: Secondary | ICD-10-CM | POA: Diagnosis not present

## 2022-04-15 DIAGNOSIS — Z0000 Encounter for general adult medical examination without abnormal findings: Secondary | ICD-10-CM | POA: Diagnosis not present

## 2022-04-15 DIAGNOSIS — G8929 Other chronic pain: Secondary | ICD-10-CM

## 2022-04-15 DIAGNOSIS — Z23 Encounter for immunization: Secondary | ICD-10-CM | POA: Diagnosis not present

## 2022-04-15 DIAGNOSIS — M76892 Other specified enthesopathies of left lower limb, excluding foot: Secondary | ICD-10-CM

## 2022-04-15 DIAGNOSIS — R7989 Other specified abnormal findings of blood chemistry: Secondary | ICD-10-CM

## 2022-04-15 DIAGNOSIS — E785 Hyperlipidemia, unspecified: Secondary | ICD-10-CM

## 2022-04-15 DIAGNOSIS — S76012A Strain of muscle, fascia and tendon of left hip, initial encounter: Secondary | ICD-10-CM | POA: Diagnosis not present

## 2022-04-15 LAB — COMPREHENSIVE METABOLIC PANEL
ALT: 15 U/L (ref 0–35)
AST: 9 U/L (ref 0–37)
Albumin: 4.1 g/dL (ref 3.5–5.2)
Alkaline Phosphatase: 72 U/L (ref 39–117)
BUN: 8 mg/dL (ref 6–23)
CO2: 27 mEq/L (ref 19–32)
Calcium: 9.3 mg/dL (ref 8.4–10.5)
Chloride: 103 mEq/L (ref 96–112)
Creatinine, Ser: 0.75 mg/dL (ref 0.40–1.20)
GFR: 96.94 mL/min (ref >60.00)
Glucose, Bld: 77 mg/dL (ref 70–99)
Potassium: 4.5 mEq/L (ref 3.5–5.1)
Sodium: 137 mEq/L (ref 135–145)
Total Bilirubin: 0.6 mg/dL (ref 0.2–1.2)
Total Protein: 7 g/dL (ref 6.0–8.3)

## 2022-04-15 LAB — CBC
HCT: 39 % (ref 36.0–46.0)
Hemoglobin: 12.7 g/dL (ref 12.0–15.0)
MCHC: 32.6 g/dL (ref 30.0–36.0)
MCV: 85.6 fl (ref 78.0–100.0)
Platelets: 527 10*3/uL — ABNORMAL HIGH (ref 150.0–400.0)
RBC: 4.56 Mil/uL (ref 3.87–5.11)
RDW: 13.8 % (ref 11.5–15.5)
WBC: 9.2 10*3/uL (ref 4.0–10.5)

## 2022-04-15 LAB — LIPID PANEL
Cholesterol: 223 mg/dL — ABNORMAL HIGH (ref 0–200)
HDL: 40.7 mg/dL (ref >39.00)
LDL Cholesterol: 163 mg/dL — ABNORMAL HIGH (ref 0–99)
NonHDL: 182.56
Total CHOL/HDL Ratio: 5
Triglycerides: 100 mg/dL (ref 0.0–149.0)
VLDL: 20 mg/dL (ref 0.0–40.0)

## 2022-04-15 MED ORDER — MELOXICAM 15 MG PO TABS: 15.0000 mg | ORAL_TABLET | Freq: Every day | ORAL | 2 refills | Status: DC

## 2022-04-15 NOTE — Patient Instructions (Addendum)
Give [place] 2-3 business days to get the results of your labs back.   Keep the diet clean and stay active.  Please get me a copy of your advanced directive form at your convenience.   Send me a message in 1 week if you don't hear anything regarding my message to Dr. Tamala Julian.   Let us know if you need anything.

## 2022-04-15 NOTE — Progress Notes (Signed)
Chief Complaint  Patient presents with   Annual Exam     Well Woman [name] is here for a complete physical.   Her last physical was >1 year ago.  Current diet: in general, a "healthy" diet. Current exercise: none lately. Weight is up a little and she denies fatigue out of ordinary. Seatbelt? Yes Advanced directive? No  Health Maintenance Pap/HPV- Yes Mammogram- No Tetanus- Done Hep C screening- No HIV screening- Yes  Past Medical History:  Diagnosis Date   Asthma    Ovarian cyst rupture 09/08/2012   left     Past Surgical History:  Procedure Laterality Date   APPENDECTOMY     OVARIAN CYST REMOVAL Right 09/08/2012   drained and removed    Medications  Current Outpatient Medications on File Prior to Visit  Medication Sig Dispense Refill   Norethindrone Acetate-Ethinyl Estradiol (JUNEL 1.5/30) 1.5-30 MG-MCG tablet Take 1 tablet by mouth daily. 28 tablet 0    Allergies Allergies  Allergen Reactions   Acetaminophen Hives   Bee Venom Hives, Itching and Swelling    Review of Systems: Constitutional:  no unexpected weight changes Eye:  no recent significant change in vision Ear/Nose/Mouth/Throat:  Ears:  no recent change in hearing Nose/Mouth/Throat:  no complaints of nasal congestion, no sore throat Cardiovascular: no chest pain Respiratory:  no shortness of breath Gastrointestinal:  no abdominal pain, no change in bowel habits GU:  Female: negative for dysuria or pelvic pain Musculoskeletal/Extremities:  +R sided back pain and L knee pain Integumentary (Skin/Breast):  no abnormal skin lesions reported Neurologic:  no headaches Endocrine:  denies fatigue Hematologic/Lymphatic:  No areas of easy bleeding  Exam BP 120/72 (BP Location: Left Arm, Patient Position: Sitting, Cuff Size: Normal)   Pulse 87   Temp 98.2 F (36.8 C) (Oral)   Ht '5\' 5"'$  (1.651 m)   Wt 201 lb 4 oz (91.3 kg)   SpO2 98%   BMI 33.49 kg/m  General:  well developed, well nourished,  in no apparent distress Skin:  no significant moles, warts, or growths Head:  no masses, lesions, or tenderness Eyes:  pupils equal and round, sclera anicteric without injection Ears:  canals without lesions, TMs shiny without retraction, no obvious effusion, no erythema Nose:  nares patent, mucosa normal, and no drainage Throat/Pharynx:  lips and gingiva without lesion; tongue and uvula midline; non-inflamed pharynx; no exudates or postnasal drainage Neck: neck supple without adenopathy, thyromegaly, or masses Lungs:  clear to auscultation, breath sounds equal bilaterally, no respiratory distress Cardio:  regular rate and rhythm, no LE edema Abdomen:  abdomen soft, nontender; bowel sounds normal; no masses or organomegaly Genital: Defer to GYN Musculoskeletal:  symmetrical muscle groups noted without atrophy or deformity; ttp over L patellar tendon.  Left knee: Negative Lachman's, patellar apprehension/grind, Stines, varus/valgus stress.  Normal range of motion, no deformity, no bony tenderness, no effusion. Extremities:  no clubbing, cyanosis, or edema, no deformities, no skin discoloration Neuro:  gait normal; deep tendon reflexes normal and symmetric Psych: well oriented with normal range of affect and appropriate judgment/insight  Assessment and Plan  Well adult exam - Plan: CBC, Comprehensive metabolic panel, Lipid panel  Encounter for hepatitis C screening test for low risk patient - Plan: Hepatitis C antibody   Well 46 y.o. female. Counseled on diet and exercise. Advanced directive form provided today.  Reached out to Dr. Tamala Julian regarding follow-up care.  MRI normal.  Get back on home exercise program. Other orders as above.  Follow up in 1 yr or prn. The patient voiced understanding and agreement to the plan.  [place], DO 04/15/22 1:17 PM

## 2022-04-16 ENCOUNTER — Encounter: Payer: Self-pay | Admitting: Family Medicine

## 2022-04-16 LAB — HEPATITIS C ANTIBODY: Hepatitis C Ab: NONREACTIVE

## 2022-04-24 ENCOUNTER — Encounter: Payer: Self-pay | Admitting: Family Medicine

## 2022-05-04 ENCOUNTER — Other Ambulatory Visit: Payer: Self-pay | Admitting: Family Medicine

## 2022-07-22 ENCOUNTER — Other Ambulatory Visit: Payer: Self-pay | Admitting: Family Medicine

## 2022-07-22 DIAGNOSIS — G8929 Other chronic pain: Secondary | ICD-10-CM

## 2022-07-22 DIAGNOSIS — M76892 Other specified enthesopathies of left lower limb, excluding foot: Secondary | ICD-10-CM

## 2022-07-22 DIAGNOSIS — S76012A Strain of muscle, fascia and tendon of left hip, initial encounter: Secondary | ICD-10-CM

## 2022-09-20 ENCOUNTER — Other Ambulatory Visit: Payer: Self-pay | Admitting: Family Medicine

## 2022-10-23 ENCOUNTER — Other Ambulatory Visit: Payer: Self-pay | Admitting: Family Medicine

## 2022-10-23 DIAGNOSIS — G8929 Other chronic pain: Secondary | ICD-10-CM

## 2022-10-23 DIAGNOSIS — S76012A Strain of muscle, fascia and tendon of left hip, initial encounter: Secondary | ICD-10-CM

## 2022-10-23 DIAGNOSIS — M76892 Other specified enthesopathies of left lower limb, excluding foot: Secondary | ICD-10-CM

## 2022-11-04 DIAGNOSIS — N926 Irregular menstruation, unspecified: Secondary | ICD-10-CM | POA: Diagnosis not present

## 2022-11-04 DIAGNOSIS — N939 Abnormal uterine and vaginal bleeding, unspecified: Secondary | ICD-10-CM | POA: Diagnosis not present

## 2022-12-17 ENCOUNTER — Encounter: Payer: Federal, State, Local not specified - PPO | Admitting: Obstetrics and Gynecology

## 2022-12-22 DIAGNOSIS — N926 Irregular menstruation, unspecified: Secondary | ICD-10-CM | POA: Diagnosis not present

## 2022-12-22 DIAGNOSIS — Z1231 Encounter for screening mammogram for malignant neoplasm of breast: Secondary | ICD-10-CM | POA: Diagnosis not present

## 2022-12-22 DIAGNOSIS — D252 Subserosal leiomyoma of uterus: Secondary | ICD-10-CM | POA: Diagnosis not present

## 2022-12-22 DIAGNOSIS — D250 Submucous leiomyoma of uterus: Secondary | ICD-10-CM | POA: Diagnosis not present

## 2022-12-22 DIAGNOSIS — D259 Leiomyoma of uterus, unspecified: Secondary | ICD-10-CM | POA: Diagnosis not present

## 2022-12-22 DIAGNOSIS — D251 Intramural leiomyoma of uterus: Secondary | ICD-10-CM | POA: Diagnosis not present

## 2022-12-22 LAB — HM MAMMOGRAPHY

## 2022-12-30 DIAGNOSIS — Z124 Encounter for screening for malignant neoplasm of cervix: Secondary | ICD-10-CM | POA: Diagnosis not present

## 2022-12-30 DIAGNOSIS — Z01419 Encounter for gynecological examination (general) (routine) without abnormal findings: Secondary | ICD-10-CM | POA: Diagnosis not present

## 2022-12-31 LAB — HM PAP SMEAR

## 2023-01-30 ENCOUNTER — Other Ambulatory Visit: Payer: Self-pay | Admitting: Family Medicine

## 2023-01-30 DIAGNOSIS — M76892 Other specified enthesopathies of left lower limb, excluding foot: Secondary | ICD-10-CM

## 2023-01-30 DIAGNOSIS — G8929 Other chronic pain: Secondary | ICD-10-CM

## 2023-01-30 DIAGNOSIS — S76012A Strain of muscle, fascia and tendon of left hip, initial encounter: Secondary | ICD-10-CM

## 2023-01-31 ENCOUNTER — Other Ambulatory Visit: Payer: Self-pay | Admitting: Family Medicine

## 2023-03-03 ENCOUNTER — Other Ambulatory Visit: Payer: Self-pay

## 2023-06-15 ENCOUNTER — Other Ambulatory Visit: Payer: Self-pay | Admitting: Family Medicine

## 2023-10-27 ENCOUNTER — Encounter: Payer: Self-pay | Admitting: Family Medicine

## 2024-01-18 DIAGNOSIS — Z1231 Encounter for screening mammogram for malignant neoplasm of breast: Secondary | ICD-10-CM | POA: Diagnosis not present

## 2024-02-11 ENCOUNTER — Other Ambulatory Visit: Payer: Self-pay | Admitting: Family Medicine
